# Patient Record
Sex: Male | Born: 1935 | Race: White | Hispanic: No | Marital: Married | Smoking: Former smoker
Health system: Southern US, Community
[De-identification: ages and names within clinical notes are randomized; demographics above are authoritative.]

## PROBLEM LIST (undated history)

## (undated) DIAGNOSIS — I739 Peripheral vascular disease, unspecified: Secondary | ICD-10-CM

## (undated) DIAGNOSIS — N200 Calculus of kidney: Secondary | ICD-10-CM

## (undated) DIAGNOSIS — K219 Gastro-esophageal reflux disease without esophagitis: Secondary | ICD-10-CM

## (undated) DIAGNOSIS — I700 Atherosclerosis of aorta: Secondary | ICD-10-CM

## (undated) DIAGNOSIS — I440 Atrioventricular block, first degree: Secondary | ICD-10-CM

## (undated) DIAGNOSIS — I10 Essential (primary) hypertension: Secondary | ICD-10-CM

## (undated) DIAGNOSIS — M779 Enthesopathy, unspecified: Secondary | ICD-10-CM

## (undated) DIAGNOSIS — E785 Hyperlipidemia, unspecified: Secondary | ICD-10-CM

## (undated) HISTORY — DX: Gastro-esophageal reflux disease without esophagitis: K21.9

## (undated) HISTORY — DX: Hyperlipidemia, unspecified: E78.5

## (undated) HISTORY — PX: COLONOSCOPY: SHX174

## (undated) HISTORY — PX: MELANOMA EXCISION: SHX5266

## (undated) HISTORY — PX: CYST REMOVAL NECK: SHX6281

## (undated) HISTORY — PX: BACK SURGERY: SHX140

## (undated) HISTORY — DX: Enthesopathy, unspecified: M77.9

## (undated) HISTORY — PX: CATARACT EXTRACTION: SUR2

## (undated) HISTORY — DX: Essential (primary) hypertension: I10

---

## 1989-08-21 DIAGNOSIS — Z85820 Personal history of malignant melanoma of skin: Secondary | ICD-10-CM

## 1989-08-21 DIAGNOSIS — C439 Malignant melanoma of skin, unspecified: Secondary | ICD-10-CM

## 1989-08-21 HISTORY — DX: Personal history of malignant melanoma of skin: Z85.820

## 1989-08-21 HISTORY — PX: MELANOMA EXCISION: SHX5266

## 1989-08-21 HISTORY — DX: Malignant melanoma of skin, unspecified: C43.9

## 2002-05-28 DIAGNOSIS — Z86018 Personal history of other benign neoplasm: Secondary | ICD-10-CM

## 2002-05-28 HISTORY — DX: Personal history of other benign neoplasm: Z86.018

## 2005-05-29 ENCOUNTER — Ambulatory Visit: Payer: Self-pay | Admitting: Unknown Physician Specialty

## 2005-08-21 DIAGNOSIS — Z85828 Personal history of other malignant neoplasm of skin: Secondary | ICD-10-CM

## 2005-08-21 HISTORY — DX: Personal history of other malignant neoplasm of skin: Z85.828

## 2006-08-10 ENCOUNTER — Ambulatory Visit: Payer: Self-pay | Admitting: Dermatology

## 2006-08-15 ENCOUNTER — Ambulatory Visit: Payer: Self-pay

## 2009-02-11 DIAGNOSIS — C4491 Basal cell carcinoma of skin, unspecified: Secondary | ICD-10-CM

## 2009-02-11 HISTORY — DX: Basal cell carcinoma of skin, unspecified: C44.91

## 2009-10-19 DIAGNOSIS — K219 Gastro-esophageal reflux disease without esophagitis: Secondary | ICD-10-CM | POA: Insufficient documentation

## 2010-07-27 ENCOUNTER — Ambulatory Visit: Payer: Self-pay | Admitting: Family Medicine

## 2010-08-21 DIAGNOSIS — Z85828 Personal history of other malignant neoplasm of skin: Secondary | ICD-10-CM

## 2010-08-21 HISTORY — DX: Personal history of other malignant neoplasm of skin: Z85.828

## 2011-07-27 LAB — CBC AND DIFFERENTIAL
HEMATOCRIT: 42 % (ref 41–53)
Hemoglobin: 13.9 g/dL (ref 13.5–17.5)
NEUTROS ABS: 59 /uL
PLATELETS: 238 10*3/uL (ref 150–399)
WBC: 4.6 10*3/mL

## 2011-09-27 DIAGNOSIS — Z85828 Personal history of other malignant neoplasm of skin: Secondary | ICD-10-CM | POA: Diagnosis not present

## 2011-09-27 DIAGNOSIS — Z85820 Personal history of malignant melanoma of skin: Secondary | ICD-10-CM | POA: Diagnosis not present

## 2011-09-27 DIAGNOSIS — L570 Actinic keratosis: Secondary | ICD-10-CM | POA: Diagnosis not present

## 2011-09-27 DIAGNOSIS — L821 Other seborrheic keratosis: Secondary | ICD-10-CM | POA: Diagnosis not present

## 2011-10-26 ENCOUNTER — Ambulatory Visit: Payer: Self-pay | Admitting: Family Medicine

## 2011-10-26 DIAGNOSIS — I10 Essential (primary) hypertension: Secondary | ICD-10-CM | POA: Diagnosis not present

## 2011-10-26 DIAGNOSIS — R05 Cough: Secondary | ICD-10-CM | POA: Diagnosis not present

## 2011-10-26 DIAGNOSIS — R059 Cough, unspecified: Secondary | ICD-10-CM | POA: Diagnosis not present

## 2011-10-26 DIAGNOSIS — Z0000 Encounter for general adult medical examination without abnormal findings: Secondary | ICD-10-CM | POA: Diagnosis not present

## 2011-10-26 DIAGNOSIS — R918 Other nonspecific abnormal finding of lung field: Secondary | ICD-10-CM | POA: Diagnosis not present

## 2011-10-26 DIAGNOSIS — J209 Acute bronchitis, unspecified: Secondary | ICD-10-CM | POA: Diagnosis not present

## 2011-12-26 ENCOUNTER — Observation Stay: Payer: Self-pay | Admitting: Internal Medicine

## 2011-12-26 DIAGNOSIS — Z7982 Long term (current) use of aspirin: Secondary | ICD-10-CM | POA: Diagnosis not present

## 2011-12-26 DIAGNOSIS — I10 Essential (primary) hypertension: Secondary | ICD-10-CM | POA: Diagnosis not present

## 2011-12-26 DIAGNOSIS — E785 Hyperlipidemia, unspecified: Secondary | ICD-10-CM | POA: Diagnosis not present

## 2011-12-26 DIAGNOSIS — Z820 Family history of epilepsy and other diseases of the nervous system: Secondary | ICD-10-CM | POA: Diagnosis not present

## 2011-12-26 DIAGNOSIS — Z8249 Family history of ischemic heart disease and other diseases of the circulatory system: Secondary | ICD-10-CM | POA: Diagnosis not present

## 2011-12-26 DIAGNOSIS — F40298 Other specified phobia: Secondary | ICD-10-CM | POA: Diagnosis not present

## 2011-12-26 DIAGNOSIS — E7800 Pure hypercholesterolemia, unspecified: Secondary | ICD-10-CM | POA: Diagnosis not present

## 2011-12-26 DIAGNOSIS — E782 Mixed hyperlipidemia: Secondary | ICD-10-CM | POA: Diagnosis not present

## 2011-12-26 DIAGNOSIS — M79609 Pain in unspecified limb: Secondary | ICD-10-CM | POA: Diagnosis not present

## 2011-12-26 DIAGNOSIS — Z79899 Other long term (current) drug therapy: Secondary | ICD-10-CM | POA: Diagnosis not present

## 2011-12-26 DIAGNOSIS — G459 Transient cerebral ischemic attack, unspecified: Secondary | ICD-10-CM | POA: Diagnosis not present

## 2011-12-26 DIAGNOSIS — R209 Unspecified disturbances of skin sensation: Secondary | ICD-10-CM | POA: Diagnosis not present

## 2011-12-26 LAB — URINALYSIS, COMPLETE
Bilirubin,UR: NEGATIVE
Glucose,UR: NEGATIVE mg/dL (ref 0–75)
Ketone: NEGATIVE
Leukocyte Esterase: NEGATIVE
Nitrite: NEGATIVE
Ph: 7 (ref 4.5–8.0)
Protein: NEGATIVE
RBC,UR: NONE SEEN /HPF (ref 0–5)
Specific Gravity: 1.002 (ref 1.003–1.030)
Squamous Epithelial: NONE SEEN
WBC UR: NONE SEEN /HPF (ref 0–5)

## 2011-12-26 LAB — CK TOTAL AND CKMB (NOT AT ARMC)
CK, Total: 129 U/L (ref 35–232)
CK-MB: 2.3 ng/mL (ref 0.5–3.6)

## 2011-12-26 LAB — COMPREHENSIVE METABOLIC PANEL
Albumin: 4.1 g/dL (ref 3.4–5.0)
Alkaline Phosphatase: 91 U/L (ref 50–136)
Anion Gap: 6 — ABNORMAL LOW (ref 7–16)
BUN: 12 mg/dL (ref 7–18)
Bilirubin,Total: 0.5 mg/dL (ref 0.2–1.0)
Calcium, Total: 9 mg/dL (ref 8.5–10.1)
Chloride: 105 mmol/L (ref 98–107)
EGFR (African American): >60
Osmolality: 285 (ref 275–301)
Total Protein: 7.5 g/dL (ref 6.4–8.2)

## 2011-12-26 LAB — CBC
HCT: 43.3 % (ref 40.0–52.0)
HGB: 14.2 g/dL (ref 13.0–18.0)
MCH: 30.1 pg (ref 26.0–34.0)
MCV: 92 fL (ref 80–100)
Platelet: 206 10*3/uL (ref 150–440)
RBC: 4.71 10*6/uL (ref 4.40–5.90)
WBC: 5 10*3/uL (ref 3.8–10.6)

## 2011-12-26 LAB — LIPID PANEL
HDL Cholesterol: 47 mg/dL (ref 40–60)
Ldl Cholesterol, Calc: 99 mg/dL (ref 0–100)
Triglycerides: 127 mg/dL (ref 0–200)
VLDL Cholesterol, Calc: 25 mg/dL (ref 5–40)

## 2011-12-26 LAB — TROPONIN I: Troponin-I: <0.02 ng/mL

## 2011-12-27 DIAGNOSIS — I10 Essential (primary) hypertension: Secondary | ICD-10-CM | POA: Diagnosis not present

## 2011-12-27 DIAGNOSIS — R209 Unspecified disturbances of skin sensation: Secondary | ICD-10-CM | POA: Diagnosis not present

## 2011-12-27 DIAGNOSIS — E782 Mixed hyperlipidemia: Secondary | ICD-10-CM | POA: Diagnosis not present

## 2011-12-27 DIAGNOSIS — G459 Transient cerebral ischemic attack, unspecified: Secondary | ICD-10-CM | POA: Diagnosis not present

## 2011-12-27 LAB — TROPONIN I: Troponin-I: <0.02 ng/mL

## 2012-01-03 DIAGNOSIS — E7800 Pure hypercholesterolemia, unspecified: Secondary | ICD-10-CM | POA: Diagnosis not present

## 2012-01-03 DIAGNOSIS — G459 Transient cerebral ischemic attack, unspecified: Secondary | ICD-10-CM | POA: Diagnosis not present

## 2012-01-03 DIAGNOSIS — I10 Essential (primary) hypertension: Secondary | ICD-10-CM | POA: Diagnosis not present

## 2012-01-08 DIAGNOSIS — R55 Syncope and collapse: Secondary | ICD-10-CM | POA: Diagnosis not present

## 2012-01-22 DIAGNOSIS — I10 Essential (primary) hypertension: Secondary | ICD-10-CM | POA: Diagnosis not present

## 2012-01-22 DIAGNOSIS — Z79899 Other long term (current) drug therapy: Secondary | ICD-10-CM | POA: Diagnosis not present

## 2012-01-22 DIAGNOSIS — E785 Hyperlipidemia, unspecified: Secondary | ICD-10-CM | POA: Diagnosis not present

## 2012-01-22 DIAGNOSIS — E7800 Pure hypercholesterolemia, unspecified: Secondary | ICD-10-CM | POA: Diagnosis not present

## 2012-03-14 DIAGNOSIS — M25519 Pain in unspecified shoulder: Secondary | ICD-10-CM | POA: Diagnosis not present

## 2012-03-14 DIAGNOSIS — M129 Arthropathy, unspecified: Secondary | ICD-10-CM | POA: Diagnosis not present

## 2012-03-14 DIAGNOSIS — M779 Enthesopathy, unspecified: Secondary | ICD-10-CM | POA: Diagnosis not present

## 2012-04-12 DIAGNOSIS — H2510 Age-related nuclear cataract, unspecified eye: Secondary | ICD-10-CM | POA: Diagnosis not present

## 2012-05-07 DIAGNOSIS — Z1211 Encounter for screening for malignant neoplasm of colon: Secondary | ICD-10-CM | POA: Diagnosis not present

## 2012-05-07 DIAGNOSIS — R1310 Dysphagia, unspecified: Secondary | ICD-10-CM | POA: Diagnosis not present

## 2012-05-08 ENCOUNTER — Ambulatory Visit: Payer: Self-pay | Admitting: Ophthalmology

## 2012-05-08 DIAGNOSIS — H2510 Age-related nuclear cataract, unspecified eye: Secondary | ICD-10-CM | POA: Diagnosis not present

## 2012-05-08 DIAGNOSIS — I10 Essential (primary) hypertension: Secondary | ICD-10-CM | POA: Diagnosis not present

## 2012-05-08 DIAGNOSIS — Z01810 Encounter for preprocedural cardiovascular examination: Secondary | ICD-10-CM | POA: Diagnosis not present

## 2012-05-14 DIAGNOSIS — Z85828 Personal history of other malignant neoplasm of skin: Secondary | ICD-10-CM | POA: Diagnosis not present

## 2012-05-14 DIAGNOSIS — B359 Dermatophytosis, unspecified: Secondary | ICD-10-CM | POA: Diagnosis not present

## 2012-05-14 DIAGNOSIS — Z85820 Personal history of malignant melanoma of skin: Secondary | ICD-10-CM | POA: Diagnosis not present

## 2012-05-14 DIAGNOSIS — L570 Actinic keratosis: Secondary | ICD-10-CM | POA: Diagnosis not present

## 2012-05-14 DIAGNOSIS — L820 Inflamed seborrheic keratosis: Secondary | ICD-10-CM | POA: Diagnosis not present

## 2012-05-20 ENCOUNTER — Ambulatory Visit: Payer: Self-pay | Admitting: Ophthalmology

## 2012-05-20 DIAGNOSIS — M129 Arthropathy, unspecified: Secondary | ICD-10-CM | POA: Diagnosis not present

## 2012-05-20 DIAGNOSIS — I10 Essential (primary) hypertension: Secondary | ICD-10-CM | POA: Diagnosis not present

## 2012-05-20 DIAGNOSIS — H269 Unspecified cataract: Secondary | ICD-10-CM | POA: Diagnosis not present

## 2012-05-20 DIAGNOSIS — I498 Other specified cardiac arrhythmias: Secondary | ICD-10-CM | POA: Diagnosis not present

## 2012-05-20 DIAGNOSIS — Z87891 Personal history of nicotine dependence: Secondary | ICD-10-CM | POA: Diagnosis not present

## 2012-05-20 DIAGNOSIS — Z85820 Personal history of malignant melanoma of skin: Secondary | ICD-10-CM | POA: Diagnosis not present

## 2012-05-20 DIAGNOSIS — H9190 Unspecified hearing loss, unspecified ear: Secondary | ICD-10-CM | POA: Diagnosis not present

## 2012-05-20 DIAGNOSIS — K219 Gastro-esophageal reflux disease without esophagitis: Secondary | ICD-10-CM | POA: Diagnosis not present

## 2012-05-20 DIAGNOSIS — H2510 Age-related nuclear cataract, unspecified eye: Secondary | ICD-10-CM | POA: Diagnosis not present

## 2012-05-20 DIAGNOSIS — Z79899 Other long term (current) drug therapy: Secondary | ICD-10-CM | POA: Diagnosis not present

## 2012-05-20 DIAGNOSIS — E7800 Pure hypercholesterolemia, unspecified: Secondary | ICD-10-CM | POA: Diagnosis not present

## 2012-05-27 DIAGNOSIS — Z23 Encounter for immunization: Secondary | ICD-10-CM | POA: Diagnosis not present

## 2012-06-11 ENCOUNTER — Ambulatory Visit: Payer: Self-pay | Admitting: Unknown Physician Specialty

## 2012-06-11 DIAGNOSIS — K5730 Diverticulosis of large intestine without perforation or abscess without bleeding: Secondary | ICD-10-CM | POA: Diagnosis not present

## 2012-06-11 DIAGNOSIS — Z1211 Encounter for screening for malignant neoplasm of colon: Secondary | ICD-10-CM | POA: Diagnosis not present

## 2012-06-11 DIAGNOSIS — R0602 Shortness of breath: Secondary | ICD-10-CM | POA: Diagnosis not present

## 2012-06-11 DIAGNOSIS — K222 Esophageal obstruction: Secondary | ICD-10-CM | POA: Diagnosis not present

## 2012-06-11 DIAGNOSIS — K449 Diaphragmatic hernia without obstruction or gangrene: Secondary | ICD-10-CM | POA: Diagnosis not present

## 2012-06-11 DIAGNOSIS — Z9889 Other specified postprocedural states: Secondary | ICD-10-CM | POA: Diagnosis not present

## 2012-06-11 DIAGNOSIS — R1310 Dysphagia, unspecified: Secondary | ICD-10-CM | POA: Diagnosis not present

## 2012-06-11 DIAGNOSIS — Z8673 Personal history of transient ischemic attack (TIA), and cerebral infarction without residual deficits: Secondary | ICD-10-CM | POA: Diagnosis not present

## 2012-06-11 DIAGNOSIS — D126 Benign neoplasm of colon, unspecified: Secondary | ICD-10-CM | POA: Diagnosis not present

## 2012-06-11 DIAGNOSIS — K219 Gastro-esophageal reflux disease without esophagitis: Secondary | ICD-10-CM | POA: Diagnosis not present

## 2012-06-11 DIAGNOSIS — Z7982 Long term (current) use of aspirin: Secondary | ICD-10-CM | POA: Diagnosis not present

## 2012-06-11 DIAGNOSIS — Z79899 Other long term (current) drug therapy: Secondary | ICD-10-CM | POA: Diagnosis not present

## 2012-06-11 DIAGNOSIS — I10 Essential (primary) hypertension: Secondary | ICD-10-CM | POA: Diagnosis not present

## 2012-06-11 DIAGNOSIS — Z87891 Personal history of nicotine dependence: Secondary | ICD-10-CM | POA: Diagnosis not present

## 2012-06-11 DIAGNOSIS — K648 Other hemorrhoids: Secondary | ICD-10-CM | POA: Diagnosis not present

## 2012-06-11 LAB — HM COLONOSCOPY

## 2012-07-25 DIAGNOSIS — Z0000 Encounter for general adult medical examination without abnormal findings: Secondary | ICD-10-CM | POA: Diagnosis not present

## 2012-07-25 DIAGNOSIS — Z125 Encounter for screening for malignant neoplasm of prostate: Secondary | ICD-10-CM | POA: Diagnosis not present

## 2012-07-25 DIAGNOSIS — H2510 Age-related nuclear cataract, unspecified eye: Secondary | ICD-10-CM | POA: Diagnosis not present

## 2012-07-25 DIAGNOSIS — Z1331 Encounter for screening for depression: Secondary | ICD-10-CM | POA: Diagnosis not present

## 2012-07-25 DIAGNOSIS — Z1339 Encounter for screening examination for other mental health and behavioral disorders: Secondary | ICD-10-CM | POA: Diagnosis not present

## 2012-08-05 ENCOUNTER — Ambulatory Visit: Payer: Self-pay | Admitting: Ophthalmology

## 2012-08-05 DIAGNOSIS — K219 Gastro-esophageal reflux disease without esophagitis: Secondary | ICD-10-CM | POA: Diagnosis not present

## 2012-08-05 DIAGNOSIS — Z79899 Other long term (current) drug therapy: Secondary | ICD-10-CM | POA: Diagnosis not present

## 2012-08-05 DIAGNOSIS — Z85820 Personal history of malignant melanoma of skin: Secondary | ICD-10-CM | POA: Diagnosis not present

## 2012-08-05 DIAGNOSIS — M129 Arthropathy, unspecified: Secondary | ICD-10-CM | POA: Diagnosis not present

## 2012-08-05 DIAGNOSIS — H269 Unspecified cataract: Secondary | ICD-10-CM | POA: Diagnosis not present

## 2012-08-05 DIAGNOSIS — E7800 Pure hypercholesterolemia, unspecified: Secondary | ICD-10-CM | POA: Diagnosis not present

## 2012-08-05 DIAGNOSIS — H2510 Age-related nuclear cataract, unspecified eye: Secondary | ICD-10-CM | POA: Diagnosis not present

## 2012-08-05 DIAGNOSIS — Z7982 Long term (current) use of aspirin: Secondary | ICD-10-CM | POA: Diagnosis not present

## 2012-08-05 DIAGNOSIS — I498 Other specified cardiac arrhythmias: Secondary | ICD-10-CM | POA: Diagnosis not present

## 2012-08-05 DIAGNOSIS — I10 Essential (primary) hypertension: Secondary | ICD-10-CM | POA: Diagnosis not present

## 2012-11-14 DIAGNOSIS — D485 Neoplasm of uncertain behavior of skin: Secondary | ICD-10-CM | POA: Diagnosis not present

## 2012-11-14 DIAGNOSIS — L821 Other seborrheic keratosis: Secondary | ICD-10-CM | POA: Diagnosis not present

## 2012-11-14 DIAGNOSIS — C44611 Basal cell carcinoma of skin of unspecified upper limb, including shoulder: Secondary | ICD-10-CM | POA: Diagnosis not present

## 2012-11-14 DIAGNOSIS — Z85828 Personal history of other malignant neoplasm of skin: Secondary | ICD-10-CM | POA: Diagnosis not present

## 2012-11-14 DIAGNOSIS — Z85820 Personal history of malignant melanoma of skin: Secondary | ICD-10-CM | POA: Diagnosis not present

## 2012-11-14 DIAGNOSIS — L570 Actinic keratosis: Secondary | ICD-10-CM | POA: Diagnosis not present

## 2012-11-14 DIAGNOSIS — L820 Inflamed seborrheic keratosis: Secondary | ICD-10-CM | POA: Diagnosis not present

## 2012-12-26 DIAGNOSIS — Z85828 Personal history of other malignant neoplasm of skin: Secondary | ICD-10-CM | POA: Diagnosis not present

## 2012-12-26 DIAGNOSIS — L570 Actinic keratosis: Secondary | ICD-10-CM | POA: Diagnosis not present

## 2012-12-26 DIAGNOSIS — C44611 Basal cell carcinoma of skin of unspecified upper limb, including shoulder: Secondary | ICD-10-CM | POA: Diagnosis not present

## 2012-12-26 DIAGNOSIS — L578 Other skin changes due to chronic exposure to nonionizing radiation: Secondary | ICD-10-CM | POA: Diagnosis not present

## 2013-01-23 DIAGNOSIS — I10 Essential (primary) hypertension: Secondary | ICD-10-CM | POA: Diagnosis not present

## 2013-01-23 DIAGNOSIS — E785 Hyperlipidemia, unspecified: Secondary | ICD-10-CM | POA: Diagnosis not present

## 2013-01-23 DIAGNOSIS — E7800 Pure hypercholesterolemia, unspecified: Secondary | ICD-10-CM | POA: Diagnosis not present

## 2013-01-23 DIAGNOSIS — Z79899 Other long term (current) drug therapy: Secondary | ICD-10-CM | POA: Diagnosis not present

## 2013-05-08 DIAGNOSIS — Z961 Presence of intraocular lens: Secondary | ICD-10-CM | POA: Diagnosis not present

## 2013-05-08 DIAGNOSIS — H2510 Age-related nuclear cataract, unspecified eye: Secondary | ICD-10-CM | POA: Diagnosis not present

## 2013-05-19 DIAGNOSIS — L578 Other skin changes due to chronic exposure to nonionizing radiation: Secondary | ICD-10-CM | POA: Diagnosis not present

## 2013-05-19 DIAGNOSIS — Z85828 Personal history of other malignant neoplasm of skin: Secondary | ICD-10-CM | POA: Diagnosis not present

## 2013-05-19 DIAGNOSIS — D239 Other benign neoplasm of skin, unspecified: Secondary | ICD-10-CM | POA: Diagnosis not present

## 2013-05-19 DIAGNOSIS — L820 Inflamed seborrheic keratosis: Secondary | ICD-10-CM | POA: Diagnosis not present

## 2013-05-19 DIAGNOSIS — L821 Other seborrheic keratosis: Secondary | ICD-10-CM | POA: Diagnosis not present

## 2013-05-19 DIAGNOSIS — L570 Actinic keratosis: Secondary | ICD-10-CM | POA: Diagnosis not present

## 2013-06-02 DIAGNOSIS — Z23 Encounter for immunization: Secondary | ICD-10-CM | POA: Diagnosis not present

## 2013-07-31 DIAGNOSIS — Z0000 Encounter for general adult medical examination without abnormal findings: Secondary | ICD-10-CM | POA: Diagnosis not present

## 2013-07-31 DIAGNOSIS — Z1331 Encounter for screening for depression: Secondary | ICD-10-CM | POA: Diagnosis not present

## 2013-07-31 DIAGNOSIS — Z1339 Encounter for screening examination for other mental health and behavioral disorders: Secondary | ICD-10-CM | POA: Diagnosis not present

## 2013-07-31 LAB — PSA: PSA: 0.9

## 2013-09-08 DIAGNOSIS — L821 Other seborrheic keratosis: Secondary | ICD-10-CM | POA: Diagnosis not present

## 2013-09-08 DIAGNOSIS — Z85820 Personal history of malignant melanoma of skin: Secondary | ICD-10-CM | POA: Diagnosis not present

## 2013-09-08 DIAGNOSIS — L578 Other skin changes due to chronic exposure to nonionizing radiation: Secondary | ICD-10-CM | POA: Diagnosis not present

## 2013-09-08 DIAGNOSIS — R234 Changes in skin texture: Secondary | ICD-10-CM | POA: Diagnosis not present

## 2013-09-08 DIAGNOSIS — L570 Actinic keratosis: Secondary | ICD-10-CM | POA: Diagnosis not present

## 2013-09-08 DIAGNOSIS — Z85828 Personal history of other malignant neoplasm of skin: Secondary | ICD-10-CM | POA: Diagnosis not present

## 2013-09-08 DIAGNOSIS — L820 Inflamed seborrheic keratosis: Secondary | ICD-10-CM | POA: Diagnosis not present

## 2014-01-01 IMAGING — CR DG CHEST 2V
1 series · 2 of 2 positions shown · non-contrast
Comparison: none

REASON FOR EXAM: L arm pain
COMMENTS:

PROCEDURE:     DXR - DXR CHEST PA (OR AP) AND LATERAL  - December 26, 2011  [DATE]
RESULT:     The lungs are clear. The cardiovascular structures are
unremarkable. Chest is stable from prior exams.

[Series 1: w chest pa · 0.14mm/px · 2 of 2 slices shown]
[im 1/2]
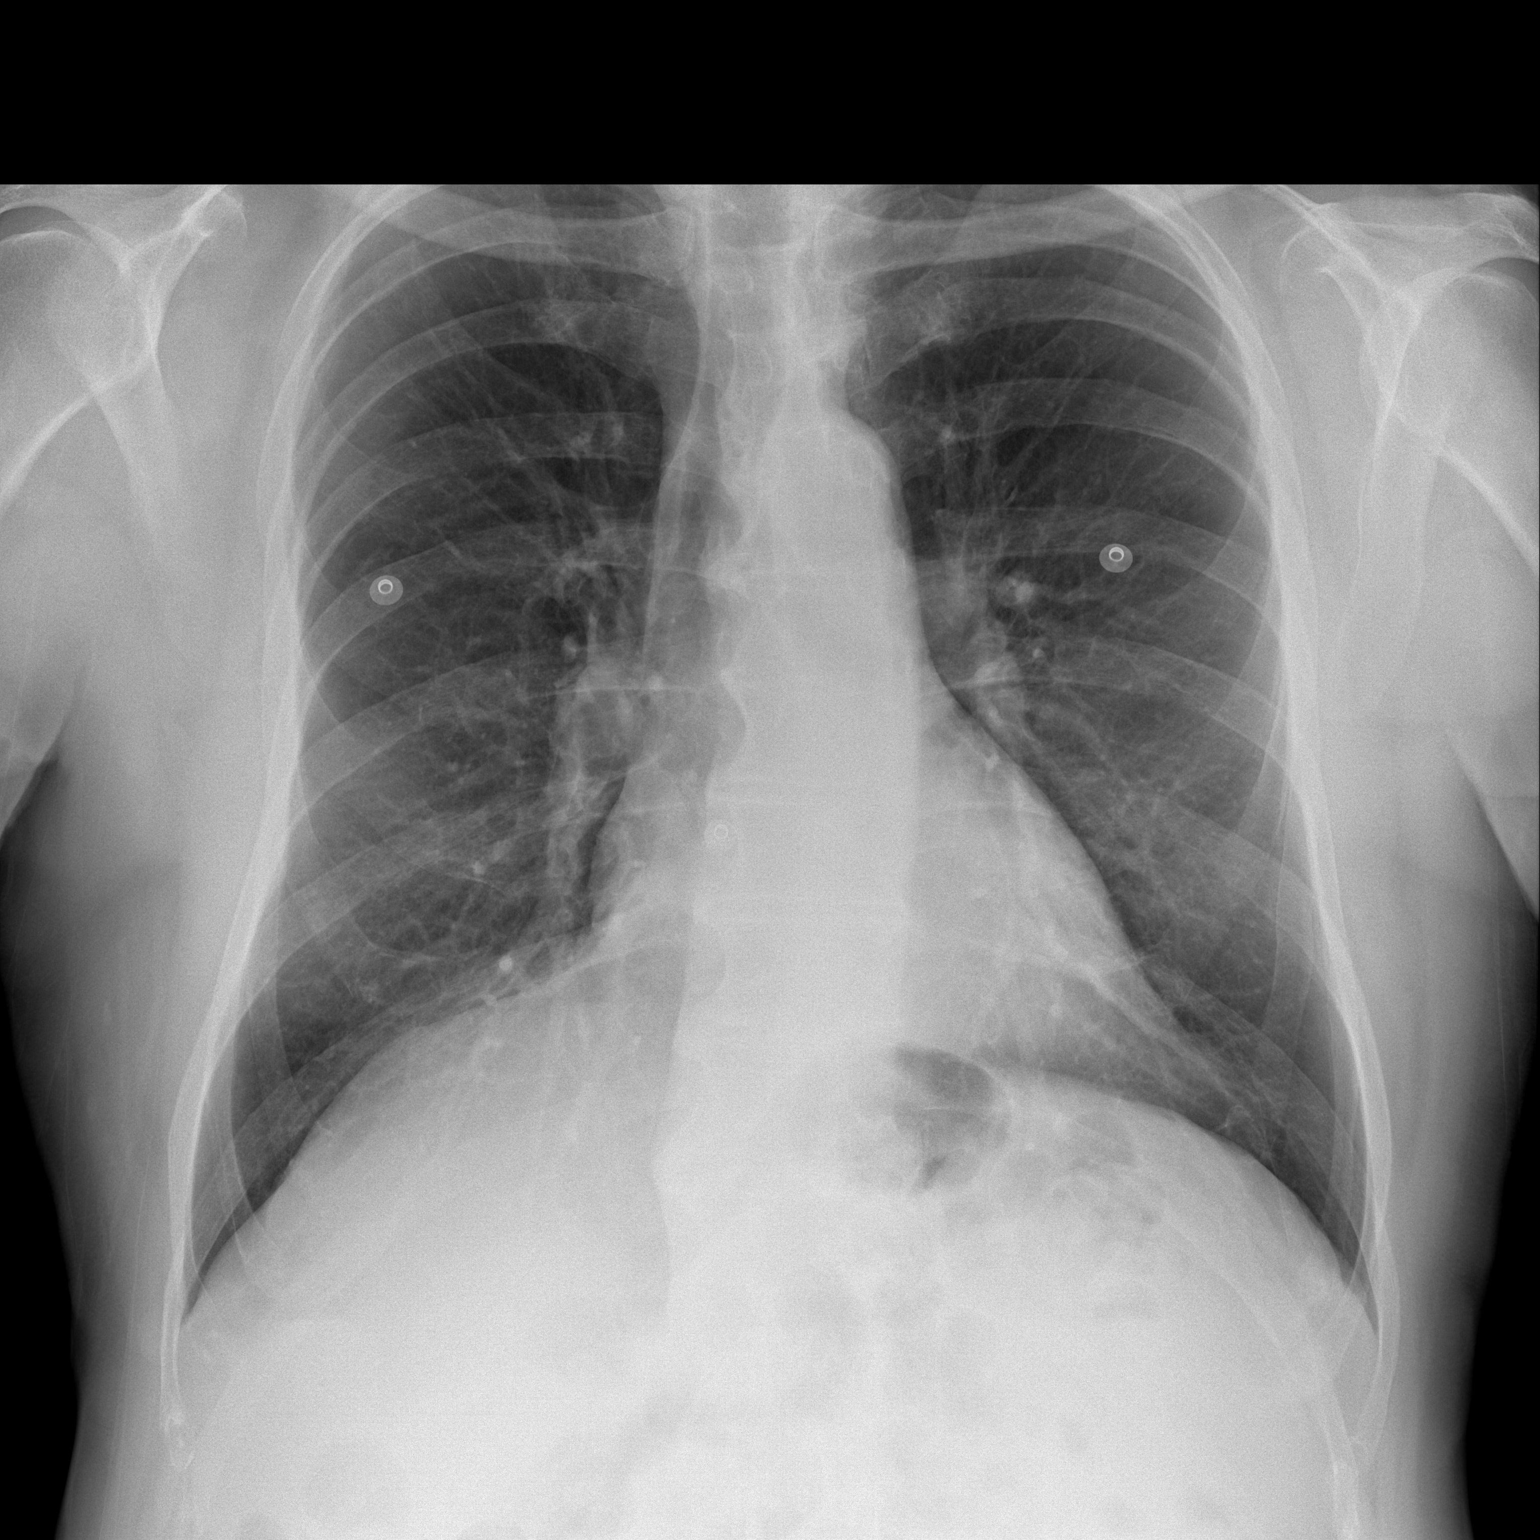
[im 2/2]
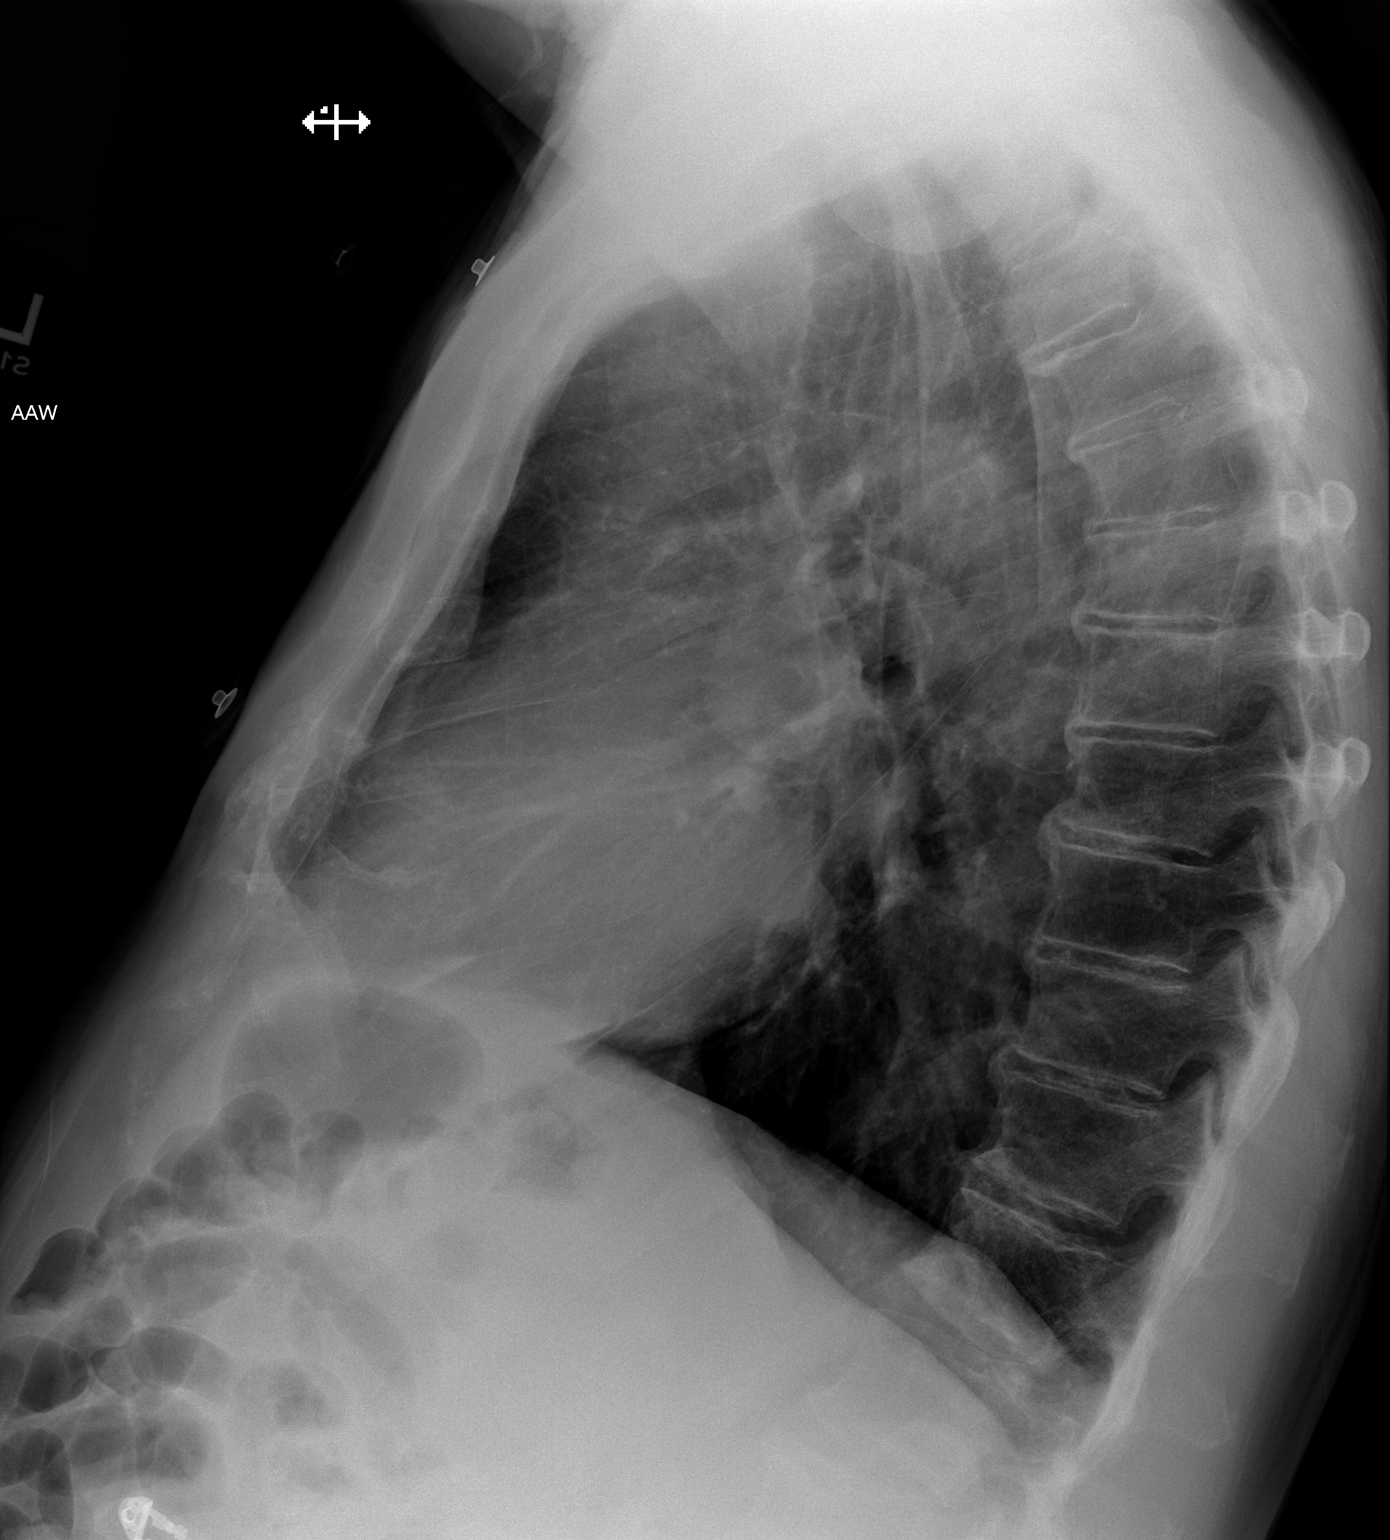

[2 of 2 positions shown; findings below may reference images not displayed]

IMPRESSION: No acute abnormality.

## 2014-02-04 DIAGNOSIS — E7800 Pure hypercholesterolemia, unspecified: Secondary | ICD-10-CM | POA: Diagnosis not present

## 2014-02-04 DIAGNOSIS — I10 Essential (primary) hypertension: Secondary | ICD-10-CM | POA: Diagnosis not present

## 2014-02-04 DIAGNOSIS — K2100 Gastro-esophageal reflux disease with esophagitis, without bleeding: Secondary | ICD-10-CM | POA: Diagnosis not present

## 2014-02-04 DIAGNOSIS — Z79899 Other long term (current) drug therapy: Secondary | ICD-10-CM | POA: Diagnosis not present

## 2014-02-04 DIAGNOSIS — M129 Arthropathy, unspecified: Secondary | ICD-10-CM | POA: Diagnosis not present

## 2014-02-04 DIAGNOSIS — E785 Hyperlipidemia, unspecified: Secondary | ICD-10-CM | POA: Diagnosis not present

## 2014-02-04 DIAGNOSIS — Z23 Encounter for immunization: Secondary | ICD-10-CM | POA: Diagnosis not present

## 2014-02-04 LAB — HEPATIC FUNCTION PANEL
ALK PHOS: 106 U/L (ref 25–125)
ALT: 30 U/L (ref 10–40)
AST: 31 U/L (ref 14–40)
BILIRUBIN, TOTAL: 0.3 mg/dL

## 2014-02-04 LAB — BASIC METABOLIC PANEL
BUN: 19 mg/dL (ref 4–21)
Creatinine: 1 mg/dL (ref <=1.3)
Glucose: 97 mg/dL
POTASSIUM: 4.9 mmol/L (ref 3.4–5.3)
SODIUM: 144 mmol/L (ref 137–147)

## 2014-02-04 LAB — LIPID PANEL
Cholesterol: 156 mg/dL (ref 0–200)
HDL: 44 mg/dL (ref 35–70)
LDL Cholesterol: 96 mg/dL
LDl/HDL Ratio: 2.2
Triglycerides: 81 mg/dL (ref 40–160)

## 2014-03-10 DIAGNOSIS — D692 Other nonthrombocytopenic purpura: Secondary | ICD-10-CM | POA: Diagnosis not present

## 2014-03-10 DIAGNOSIS — T148 Other injury of unspecified body region: Secondary | ICD-10-CM | POA: Diagnosis not present

## 2014-03-10 DIAGNOSIS — L570 Actinic keratosis: Secondary | ICD-10-CM | POA: Diagnosis not present

## 2014-03-10 DIAGNOSIS — Z85820 Personal history of malignant melanoma of skin: Secondary | ICD-10-CM | POA: Diagnosis not present

## 2014-03-10 DIAGNOSIS — L578 Other skin changes due to chronic exposure to nonionizing radiation: Secondary | ICD-10-CM | POA: Diagnosis not present

## 2014-03-10 DIAGNOSIS — D239 Other benign neoplasm of skin, unspecified: Secondary | ICD-10-CM | POA: Diagnosis not present

## 2014-03-10 DIAGNOSIS — Z85828 Personal history of other malignant neoplasm of skin: Secondary | ICD-10-CM | POA: Diagnosis not present

## 2014-03-10 DIAGNOSIS — L820 Inflamed seborrheic keratosis: Secondary | ICD-10-CM | POA: Diagnosis not present

## 2014-06-04 DIAGNOSIS — H532 Diplopia: Secondary | ICD-10-CM | POA: Diagnosis not present

## 2014-06-04 DIAGNOSIS — Z23 Encounter for immunization: Secondary | ICD-10-CM | POA: Diagnosis not present

## 2014-08-04 DIAGNOSIS — Z0000 Encounter for general adult medical examination without abnormal findings: Secondary | ICD-10-CM | POA: Diagnosis not present

## 2014-09-10 DIAGNOSIS — D485 Neoplasm of uncertain behavior of skin: Secondary | ICD-10-CM | POA: Diagnosis not present

## 2014-09-10 DIAGNOSIS — Z85820 Personal history of malignant melanoma of skin: Secondary | ICD-10-CM | POA: Diagnosis not present

## 2014-09-10 DIAGNOSIS — L570 Actinic keratosis: Secondary | ICD-10-CM | POA: Diagnosis not present

## 2014-09-10 DIAGNOSIS — D1800 Hemangioma unspecified site: Secondary | ICD-10-CM | POA: Diagnosis not present

## 2014-09-10 DIAGNOSIS — Z1283 Encounter for screening for malignant neoplasm of skin: Secondary | ICD-10-CM | POA: Diagnosis not present

## 2014-09-10 DIAGNOSIS — D229 Melanocytic nevi, unspecified: Secondary | ICD-10-CM | POA: Diagnosis not present

## 2014-09-10 DIAGNOSIS — L821 Other seborrheic keratosis: Secondary | ICD-10-CM | POA: Diagnosis not present

## 2014-09-10 DIAGNOSIS — L578 Other skin changes due to chronic exposure to nonionizing radiation: Secondary | ICD-10-CM | POA: Diagnosis not present

## 2014-09-10 DIAGNOSIS — Z85828 Personal history of other malignant neoplasm of skin: Secondary | ICD-10-CM | POA: Diagnosis not present

## 2014-09-10 DIAGNOSIS — L820 Inflamed seborrheic keratosis: Secondary | ICD-10-CM | POA: Diagnosis not present

## 2014-09-10 DIAGNOSIS — L304 Erythema intertrigo: Secondary | ICD-10-CM | POA: Diagnosis not present

## 2014-09-10 DIAGNOSIS — L814 Other melanin hyperpigmentation: Secondary | ICD-10-CM | POA: Diagnosis not present

## 2014-12-08 NOTE — Op Note (Signed)
PATIENT NAME:  Adam Hanna, Adam Hanna MR#:  [id number] DATE OF BIRTH:  September 24, 1925  DATE OF PROCEDURE:  08/05/2012  PREOPERATIVE DIAGNOSIS: Cataract, left eye.   POSTOPERATIVE DIAGNOSIS: Cataract, left eye.   PROCEDURE PERFORMED: Extracapsular cataract extraction using phacoemulsification with placement of an Alcon [id number] 15.5-diopter posterior chamber lens with 1.5 diopters a cylinder, serial number [id number].   SURGEON: Loura Back. Thanh Pomerleau, MD   ANESTHESIA: 4% Lidocaine and 0.75% Marcaine in a 50:50 mixture with 10 units/mL of Hylenex added, given as a peribulbar.   ANESTHESIOLOGIST: Dr. Kayleen Memos.   COMPLICATIONS: None.   ESTIMATED BLOOD LOSS: Less than 1 mL.   DESCRIPTION OF PROCEDURE: The patient was brought to the Operating Room and each eye was anesthetized with topical proparacaine. With the patient sitting upright and fixing at a distant target, the 3:00 and the 9:00 positions were marked using An [employer] Toric Marker. This was reinforced with a marking pen. The patient was placed supine, given IV sedation and peribulbar block. He was then prepped and draped in the usual fashion. The vertical rectus muscles were imbricated using 5-0 silk bridle sutures. The Toric Marker was then brought to the table, and 76 degrees was marked as it was lined up with the 3:00 and 9:00 positions. The was rotated slightly counterclockwise, and a limbal peritomy was carried out at the 80-degree position. Hemostasis was attained with cautery, and a partial thickness scleral groove was made at the posterior surgical limbus and dissected anteriorly into clear cornea with an [employer]. The anterior chamber was entered superonasally through clear cornea with a paracentesis knife and through the lamellar dissection with a 2.6 mm keratome. DisCoVisc was used to replace the aqueous, and a continuous tear circular capsulorrhexis was carried out. Hydrodelineation was used to loosen the nucleus, and  phacoemulsification was carried out in a divide and conquer technique. Total ultrasound time was 1 minute and 27 seconds with an average power of 16.6%, CDE of 24.94. Irrigation and aspiration was used to remove the residual cortex. The capsular bag was inflated with DisCoVisc, and the intraocular lens was inserted using a Librarian, academic. The marks on the haptics were lined up with the 76-degree marks that had been made on the cornea. Irrigation and aspiration was used to remove the residual DisCoVisc. Care was taken to make sure that the lens was still in the right position. The wound was inflated with balanced salt, and Miostat was injected through paracentesis tract. The wound was checked for leaks. None were found. Conjunctiva was closed with cautery. Bridle sutures were removed, and 3 drops of Vigamox were placed on the eye. A shield was placed over the eye. The patient was discharged to the recovery area in good condition.   ____________________________ Loura Back Kylynn [address], MD sad:cb D: 08/05/2012 13:16:58 ET T: 08/06/2012 11:32:04 ET JOB#: [id number]  cc: Remo Lipps A. Shifa Brisbon, MD, <Dictator> Martie Lee MD ELECTRONICALLY SIGNED 08/12/2012 12:47

## 2014-12-08 NOTE — Op Note (Signed)
PATIENT NAME:  Adam Hanna, Adam Hanna MR#:  [id number] DATE OF BIRTH:  08-27-1925  DATE OF PROCEDURE:  05/20/2012  PREOPERATIVE DIAGNOSIS:  Cataract, right eye.   POSTOPERATIVE DIAGNOSIS:  Cataract, right eye.  PROCEDURE PERFORMED:  Extracapsular cataract extraction using phacoemulsification with placement of an Alcon [id number], 16.0-diopter posterior chamber lens, serial [id number].  SURGEON:  Loura Back. Dariann Huckaba, MD  ASSISTANT:  None.  ANESTHESIA:  4% lidocaine and 0.75% Marcaine in a 50/50 mixture with 10 units/mL of Hylenex added, given as a peribulbar.   ANESTHESIOLOGIST:  Julianne Handler, MD  COMPLICATIONS:  None.  ESTIMATED BLOOD LOSS:  Less than 1 ml.  DESCRIPTION OF PROCEDURE:  The patient was brought to the operating room and given a peribulbar block.  The patient was then prepped and draped in the usual fashion.  The vertical rectus muscles were imbricated using 5-0 silk sutures.  These sutures were then clamped to the sterile drapes as bridle sutures.  A limbal peritomy was performed extending two clock hours and hemostasis was obtained with cautery.  A partial thickness scleral groove was made at the surgical limbus and dissected anteriorly in a lamellar dissection using an Alcon crescent knife.  The anterior chamber was entered superonasally with a Superblade and through the lamellar dissection with a 2.6 mm keratome.  DisCoVisc was used to replace the aqueous and a continuous tear capsulorrhexis was carried out.  Hydrodissection and hydrodelineation were carried out with balanced salt and a 27 gauge canula.  The nucleus was rotated to confirm the effectiveness of the hydrodissection.  Phacoemulsification was carried out using a divide-and-conquer technique.  Total ultrasound time was 1 minute and 38 seconds with an average power of 16.4 percent and CDE 28.56.  Irrigation/aspiration was used to remove the residual cortex.  DisCoVisc was used to inflate the capsule and the  internal incision was enlarged to 3 mm with the crescent knife.  The intraocular lens was folded and inserted into the capsular bag using the AcrySert delivery system. Irrigation/aspiration was used to remove the residual DisCoVisc.  Miostat was injected into the anterior chamber through the paracentesis track to inflate the anterior chamber and induce miosis.  The wound was checked for leaks and none were found. The conjunctiva was closed with cautery and the bridle sutures were removed.  Two drops of 0.3% Vigamox were placed on the eye.   An eye shield was placed on the eye.  The patient was discharged to the recovery room in good condition. ____________________________ Loura Back Solmon Bohr, MD sad:slb D: 05/20/2012 12:35:15 ET T: 05/20/2012 12:41:23 ET JOB#: [id number]  cc: Remo Lipps A. Dalayna Lauter, MD, <Dictator> Martie Lee MD ELECTRONICALLY SIGNED 05/27/2012 13:51

## 2014-12-13 NOTE — Discharge Summary (Signed)
PATIENT NAME:  Adam Hanna, Adam Hanna MR#:  [id number] DATE OF BIRTH:  07-11-1926  DATE OF ADMISSION:  12/26/2011 DATE OF DISCHARGE:  12/27/2011  PRIMARY CARE PHYSICIAN: Dr. Rosanna Randy  ER PHYSICIAN: Dr. Ulice Brilliant   CONSULTATIONS: None.   DISCHARGE DIAGNOSES:  1. Left hand tingling and left face tingling. The patient has possible transient ischemic attack. Refused further testing.  2. Hypertension. 3. Hyperlipidemia.   DISCHARGE MEDICATIONS:  1. Aspirin 81 mg daily.  2. Lipitor 20 mg at bedtime.  3. Norvasc 5 mg daily. 4. Probiotics as needed. 5. Centrum Silver 1 tablet daily.   FOLLOWUP: The patient can follow up with Dr. Rosanna Randy in a week or if the symptoms of tingling or numbness worsen.  LABORATORY, DIAGNOSTIC, AND RADIOLOGICAL DATA: LDL 99, VLDL 25, HDL 47, triglycerides 127. Troponin is less than 0.02 times three. CK total 129 and CPK-MB were 2.3. Liver functions are within normal limits. Electrolytes within normal limits. CBC within normal limits. Urinalysis is clear. No leukocyte esterase. Chest x-ray did not show any infiltrate. No acute pneumonia. The patient's TSH is 2.31. EKG normal sinus with some PACs. No ST-T changes.   HOSPITAL COURSE: 81 year old male admitted by me on 05/07 for left hand tingling and left face sensation was different.  See the History and Physical for full details.  The patient had no headache. No visual disturbances. No weakness. No slurred speech and did not have any other symptoms. He was admitted to observation status for possible transient ischemic attack. The patient had no further neurological symptoms and did fine. He refused MRI initially because of claustrophobia even with sedation.  Today he refused to have echo and carotid ultrasound and he said his symptoms have gone and he does not think he had a transient ischemic attack. I explained to him that these tests are recommended due to his age and also hypertension and hypercholesterolemia to make sure he has  no transient ischemic attack, but he persistently refused it and wanted to go home. So I told him that if symptoms get worse in terms of weakness or numbness or slurred speech, he can go to the Emergency Room or call his primary doctor. He understands that and conveyed that he will do that.  The patient can continue his home medications and follow with Dr. Rosanna Randy.  TIME SPENT ON DISCHARGE PREPARATION: More than 30 minutes.    ____________________________ Epifanio Lesches, MD sk:bjt D: 12/27/2011 09:50:41 ET T: 12/27/2011 13:54:14 ET JOB#: [id number]  cc: Epifanio Lesches, MD, <Dictator> Richard L. Rosanna Randy, MD Epifanio Lesches MD ELECTRONICALLY SIGNED 01/03/2012 22:54

## 2014-12-13 NOTE — H&P (Signed)
PATIENT NAME:  Adam Hanna, PRISK MR#:  [id number] DATE OF BIRTH:  1925/09/19  DATE OF ADMISSION:  12/26/2011  PRIMARY CARE PHYSICIAN: Dr. Rosanna Randy  ER PHYSICIAN: Dr. Ulice Brilliant   CHIEF COMPLAINT: Tingling in the left hand and left side of face.   HISTORY OF PRESENT ILLNESS: 81 year old male with history of hypertension, hyperlipidemia, tingling in the left hand around wrist area around 8:00 this morning which has subsided after 30-40 minutes. At the same time he noticed somewhat different feeling on the left side of the face, kind of numb on the left side of face which actually resolved by the time they came to the Emergency Room. Patient did not have any slurred speech. No headache. No weakness in hands or legs. No gait disturbances and no numbness in any other parts of the area. Patient went to CAT scan of the head and after coming back from CAT scan he again noted to have some tingling in the left hand and numb on the left side of his face  which have resolved, probably 10 minutes. I was asked to admit for possible transient ischemic attack. Patient had no chest pain, no trouble breathing, no dizziness and the symptoms did not happen before.   PAST MEDICAL HISTORY:  1. Hypertension. 2. Hyperlipidemia.  3. History of stress test years ago, like six years ago, with Dr. Neoma Laming, reportedly it was normal.   ALLERGIES: No known allergies.   SOCIAL HISTORY: No smoking. Drinks two glasses of wine before dinner and after dinner. No drugs.   PAST SURGICAL HISTORY: No history of coronary artery disease. No stents. No history of gallbladder surgery. Patient had no recent surgeries.   FAMILY HISTORY: Mother and father both were healthy up to 26 years and mother died of heart failure, father died of advanced dementia.  MEDICATIONS:  1. Norvasc 5 mg daily. 2. Lipitor 20 mg daily. 3. Aspirin 81 mg daily.   REVIEW OF SYSTEMS: CONSTITUTIONAL: Denies any fatigue. EYES: No blurred vision. ENT: Has no ear  pain. No epistaxis. No difficulty swallowing. RESPIRATORY: Has no cough. No wheezing. CARDIOVASCULAR: Denies any chest pain and no palpitations. GASTROINTESTINAL: No nausea. No vomiting. No appetite problems. NEUROLOGIC: Patient has tingling in the left hand and then on the left side of the face this morning which resolved now. PSYCH: Has anxiety or insomnia.   PHYSICAL EXAMINATION:  VITAL SIGNS: Temperature 97.7, initial blood pressure 180/85, repeat 144/73, heart rate 77, sats 95% on room air.   GENERAL: He is alert, awake, oriented, well-nourished 81 year old male, not in distress, answering questions appropriately.   HEENT: Head atraumatic, normocephalic. Pupils are equally reacting to light. Extraocular movements are intact. ENT: The patient has no tympanic membrane congestion. Hearing is intact. Nose: No turbinate hypertrophy. Throat: No oropharyngeal erythema.   NECK: Normal range of motion. No carotid bruit. No lymph node enlargement. No thyroid enlargement. Patient has no JVD.   CARDIOVASCULAR: S1, S2 regular. PMI not displaced. No chest wall tenderness. Pulses are equal and full in dorsalis pedis and femoral regions.   LUNGS: Patient is not using accessory muscles of respiration. Clear to auscultation bilaterally.   ABDOMEN: Soft, nontender, nondistended. No hernias. No organomegaly. Bowel sounds present.   EXTREMITIES: No extremity edema. No cyanosis. No clubbing.   NEUROLOGIC: Patient is alert, awake, oriented. Cranial nerves II through XII intact. Power 5/5 upper and lower extremities. Sensations are intact bilaterally upper and lower extremities. Right now he denies any numbness on his face or the arm.  Deep tendon reflexes 2+ bilaterally upper and lower extremities. Finger-nose test is intact.    PSYCH: Mood and affect are within normal limits.   LABORATORY, DIAGNOSTIC AND RADIOLOGICAL DATA: Chest x-ray shows no acute abnormality. EKG normal sinus rhythm with PACs. No ST-T  changes, 69 beats per minute. Urinalysis: Clear. WBC 5, hemoglobin 14.2, hematocrit 43.3, platelets 203. Electrolytes: Sodium 143, potassium 4.2, chloride 105, bicarbonate 32, BUN 12, creatinine 0.96, glucose 105. Liver functions within normal limits. Troponin less than 0.02. CT of the head showed mild atrophy. No acute intracranial abnormality.   ASSESSMENT AND PLAN: 81 year old male with:  1. Tingling in the left hand and face which appeared transiently, now resolved. Patient is going to be placed in observation for possible transient ischemic attack. Patient says that he cannot have MRI because of claustrophobia and refuses MRI. He will get carotid ultrasound, echocardiogram. Continue aspirin and statins and will do neurological checks q.2 hours up to six hours than q.4 hours later on for 24 hours. It the carotid ultrasound or echocardiogram are positive will do further testing, otherwise, patient can go home and have repeat CT of the head again in two days to follow up as he has refused to have MRA of the brain.  2. Hypertension. Blood pressure slightly elevated. I have added beta blockers and patient can continue his Norvasc as well.  3. Hyperlipidemia. He is on statins.  4. Condition at this time is stable.   TIME SPENT: More than 60 minutes on history and physical.   Discussed the plan with patient and patient's wife in agreement for this plan.    ____________________________ Epifanio Lesches, MD sk:cms D: 12/26/2011 14:15:05 ET T: 12/26/2011 14:43:49 ET JOB#: [id number]  cc: Epifanio Lesches, MD, <Dictator> Richard L. Rosanna Randy, MD Epifanio Lesches MD ELECTRONICALLY SIGNED 01/03/2012 23:32

## 2014-12-25 DIAGNOSIS — N529 Male erectile dysfunction, unspecified: Secondary | ICD-10-CM | POA: Insufficient documentation

## 2014-12-25 DIAGNOSIS — M129 Arthropathy, unspecified: Secondary | ICD-10-CM | POA: Insufficient documentation

## 2014-12-25 DIAGNOSIS — I491 Atrial premature depolarization: Secondary | ICD-10-CM | POA: Insufficient documentation

## 2014-12-25 DIAGNOSIS — I6782 Cerebral ischemia: Secondary | ICD-10-CM | POA: Insufficient documentation

## 2014-12-25 DIAGNOSIS — N3281 Overactive bladder: Secondary | ICD-10-CM | POA: Insufficient documentation

## 2014-12-25 DIAGNOSIS — G939 Disorder of brain, unspecified: Secondary | ICD-10-CM | POA: Insufficient documentation

## 2014-12-25 DIAGNOSIS — E785 Hyperlipidemia, unspecified: Secondary | ICD-10-CM | POA: Insufficient documentation

## 2015-02-23 ENCOUNTER — Encounter: Payer: Self-pay | Admitting: Family Medicine

## 2015-02-23 ENCOUNTER — Ambulatory Visit (INDEPENDENT_AMBULATORY_CARE_PROVIDER_SITE_OTHER): Payer: Medicare Other | Admitting: Family Medicine

## 2015-02-23 VITALS — BP 108/54 | HR 64 | Temp 97.7°F | Resp 16 | Ht 73.0 in | Wt 207.0 lb

## 2015-02-23 DIAGNOSIS — I10 Essential (primary) hypertension: Secondary | ICD-10-CM

## 2015-02-23 DIAGNOSIS — K219 Gastro-esophageal reflux disease without esophagitis: Secondary | ICD-10-CM

## 2015-02-23 DIAGNOSIS — E785 Hyperlipidemia, unspecified: Secondary | ICD-10-CM | POA: Diagnosis not present

## 2015-02-23 NOTE — Progress Notes (Signed)
Patient ID: Adam Hanna., male   DOB: 1926/02/26, 81 y.o.   MRN: [id number]   Adam Hanna.  MRN: [id number] DOB: [birth year]  Subjective:  HPI   1. Essential hypertension Patient is an 81 year old male who presents for follow up of his hypertension.  His last visit was on 08/04/14.  His blood pressure at that time was 120/68.  He is currently on Nifedipine.  He has had a 20 pound weight loss and would like to see if he could come off of it due to his pressure being so good.  He reports good compliance and tolerance of the medication.  2. Gastroesophageal reflux disease, esophagitis presence not specified Patient is currently taking his Omeprazole twice daily.  He reports a 20 pound weight loss with significant dietary changes and feels he probably does not need to be taking this medication.  3. Hyperlipidemia The patient had his lipids checked on 02/04/14.  He is due for this level and is curious to have it checked due to the weight loss.  He is currently on Atorvastatin and reports no adverse effects of the medication.     Patient Active Problem List   Diagnosis Date Noted  . 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 Dyslipidemia 12/25/2014  . 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 Temporary cerebral vascular dysfunction 12/25/2014  . Acid reflux 10/19/2009  . BP (high blood pressure) 06/23/2008    Past Medical History  Diagnosis Date  . GERD (gastroesophageal reflux disease)   . Hyperlipidemia   . Hypertension     History   Social History  . Marital Status: Married    Spouse Name: N/A  . Number of Children: N/A  . Years of Education: N/A   Occupational History  . Not on file.   Social History Main Topics  . Smoking status: Former Smoker -- 1.00 packs/day for 20 years    Types: Cigarettes    Quit date: 08/21/1959  . Smokeless tobacco: Not on file  . Alcohol Use: 4.2 oz/week    7  Standard drinks or equivalent per week     Comment: 1 glass red wine daily  . Drug Use: No  . Sexual Activity: Not on file   Other Topics Concern  . Not on file   Social History Narrative    Outpatient Prescriptions Prior to Visit  Medication Sig Dispense Refill  . aspirin 81 MG EC tablet RA ASPIRIN EC, 81MG  (Oral Tablet Delayed Release)  1 Every Day for 0 days  Quantity: 0.00;  Refills: 0   Ordered :27-Jul-2010  Abran  ;  Started 02-Nov-2008 Active    . atorvastatin (LIPITOR) 20 MG tablet Take by mouth.    . Multiple Vitamins-Minerals (MULTIVITAMIN ADULT PO) MULTIVITAMINS (Oral Tablet)  1 Every Day for 0 days  Quantity: 0.00;  Refills: 0   Ordered :27-Jul-2010  Doy Hutching ;  Started 06-Jan-2009 Active    . Naproxen Sodium 220 MG CAPS Take by mouth.    Marland Kitchen NIFEdipine (PROCARDIA-XL/ADALAT-CC/NIFEDICAL-XL) 30 MG 24 hr tablet Take by mouth.    Marland Kitchen omeprazole (PRILOSEC) 20 MG capsule Take by mouth.     No facility-administered medications prior to visit.    No Known Allergies  Review of Systems  Constitutional: Negative.   Respiratory: Negative.   Cardiovascular: Negative.   Neurological: Negative for headaches.   Objective:  BP 108/54 mmHg  Pulse 64  Temp(Src) 97.7 F (36.5 C) (  Oral)  Resp 16  Ht 6\' 1"  (1.854 m)  Wt 207 lb (93.895 kg)  BMI 27.32 kg/m2  Physical Exam  Constitutional: He is oriented to person, place, and time and well-developed, well-nourished, and in no distress.  HENT:  Head: Normocephalic and atraumatic.  Right Ear: External ear normal.  Left Ear: External ear normal.  Nose: Nose normal.  Eyes: Conjunctivae and EOM are normal. Pupils are equal, round, and reactive to light.  Neck: Normal range of motion. Neck supple.  Cardiovascular: Normal rate, regular rhythm and normal heart sounds.   Pulmonary/Chest: Effort normal and breath sounds normal.  Abdominal: Soft. Bowel sounds are normal.  Neurological: He is alert and oriented to person, place,  and time. Gait normal.  Skin: Skin is warm and dry.  Psychiatric: Mood, memory, affect and judgment normal.    Assessment and Plan :   1. Essential hypertension Patient has had weight loss and has made dietary changes.  He states he does feel more tired since his weight loss.  Patient is going to try coming off of the Nifedipine.  He will check his blood pressure readings and bring in those readings on his next visit.  He will wean off of the medication over the next week.  We will see him back in 2 months just to make sure he is doing well off of the medication.  2. Gastroesophageal reflux disease, esophagitis presence not specified Patient has had significant weight loss with dietary changes.  He is to decrease his Omeprazole from BID to daily for the month of July.  In August he can go to M-W-F.  If he continues to be asymptomatic he will discontinue use at the end of August.  3. Hyperlipidemia  4. Intentional weight loss Ratio is lost 20 pounds with watching his diet. We'll see how his blood pressure does with his weight loss. He is to check blood pressure at home daily and bring in these readings in 2 months.   Miguel Aschoff MD [facility] 02/23/2015 11:33 AM

## 2015-02-26 DIAGNOSIS — I10 Essential (primary) hypertension: Secondary | ICD-10-CM | POA: Diagnosis not present

## 2015-02-26 DIAGNOSIS — E785 Hyperlipidemia, unspecified: Secondary | ICD-10-CM | POA: Diagnosis not present

## 2015-02-27 ENCOUNTER — Telehealth: Payer: Self-pay | Admitting: Family Medicine

## 2015-02-27 LAB — COMPREHENSIVE METABOLIC PANEL
ALT: 23 IU/L (ref 0–44)
AST: 29 IU/L (ref 0–40)
Albumin/Globulin Ratio: 1.9 (ref 1.1–2.5)
Albumin: 4.2 g/dL (ref 3.5–4.7)
Alkaline Phosphatase: 100 IU/L (ref 39–117)
BUN / CREAT RATIO: 18 (ref 10–22)
BUN: 19 mg/dL (ref 8–27)
Bilirubin Total: 0.6 mg/dL (ref 0.0–1.2)
CO2: 27 mmol/L (ref 18–29)
Calcium: 9.1 mg/dL (ref 8.6–10.2)
Chloride: 102 mmol/L (ref 97–108)
Creatinine, Ser: 1.03 mg/dL (ref 0.76–1.27)
GFR, EST AFRICAN AMERICAN: 76 mL/min/{1.73_m2} (ref >59)
GFR, EST NON AFRICAN AMERICAN: 65 mL/min/{1.73_m2} (ref >59)
GLUCOSE: 99 mg/dL (ref 65–99)
Globulin, Total: 2.2 g/dL (ref 1.5–4.5)
Potassium: 5 mmol/L (ref 3.5–5.2)
Sodium: 143 mmol/L (ref 134–144)
Total Protein: 6.4 g/dL (ref 6.0–8.5)

## 2015-02-27 LAB — LIPID PANEL WITH LDL/HDL RATIO
CHOLESTEROL TOTAL: 148 mg/dL (ref 100–199)
HDL: 43 mg/dL (ref >39)
LDL CALC: 79 mg/dL (ref 0–99)
LDL/HDL RATIO: 1.8 ratio (ref 0.0–3.6)
TRIGLYCERIDES: 132 mg/dL (ref 0–149)
VLDL CHOLESTEROL CAL: 26 mg/dL (ref 5–40)

## 2015-02-27 LAB — TSH: TSH: 2.01 u[IU]/mL (ref 0.450–4.500)

## 2015-02-27 NOTE — Telephone Encounter (Signed)
Labs reviewed but I did not clinical messaging the patient. Labs are stable.

## 2015-03-22 DIAGNOSIS — L821 Other seborrheic keratosis: Secondary | ICD-10-CM | POA: Diagnosis not present

## 2015-03-22 DIAGNOSIS — D1800 Hemangioma unspecified site: Secondary | ICD-10-CM | POA: Diagnosis not present

## 2015-03-22 DIAGNOSIS — D692 Other nonthrombocytopenic purpura: Secondary | ICD-10-CM | POA: Diagnosis not present

## 2015-03-22 DIAGNOSIS — L570 Actinic keratosis: Secondary | ICD-10-CM | POA: Diagnosis not present

## 2015-03-22 DIAGNOSIS — Z1283 Encounter for screening for malignant neoplasm of skin: Secondary | ICD-10-CM | POA: Diagnosis not present

## 2015-03-22 DIAGNOSIS — L820 Inflamed seborrheic keratosis: Secondary | ICD-10-CM | POA: Diagnosis not present

## 2015-03-22 DIAGNOSIS — D229 Melanocytic nevi, unspecified: Secondary | ICD-10-CM | POA: Diagnosis not present

## 2015-03-22 DIAGNOSIS — Z85820 Personal history of malignant melanoma of skin: Secondary | ICD-10-CM | POA: Diagnosis not present

## 2015-03-22 DIAGNOSIS — D485 Neoplasm of uncertain behavior of skin: Secondary | ICD-10-CM | POA: Diagnosis not present

## 2015-03-22 DIAGNOSIS — L578 Other skin changes due to chronic exposure to nonionizing radiation: Secondary | ICD-10-CM | POA: Diagnosis not present

## 2015-04-27 ENCOUNTER — Ambulatory Visit: Payer: Medicare Other | Admitting: Family Medicine

## 2015-06-10 DIAGNOSIS — Z23 Encounter for immunization: Secondary | ICD-10-CM | POA: Diagnosis not present

## 2015-09-22 DIAGNOSIS — L578 Other skin changes due to chronic exposure to nonionizing radiation: Secondary | ICD-10-CM | POA: Diagnosis not present

## 2015-09-22 DIAGNOSIS — L821 Other seborrheic keratosis: Secondary | ICD-10-CM | POA: Diagnosis not present

## 2015-09-22 DIAGNOSIS — D1800 Hemangioma unspecified site: Secondary | ICD-10-CM | POA: Diagnosis not present

## 2015-09-22 DIAGNOSIS — Z1283 Encounter for screening for malignant neoplasm of skin: Secondary | ICD-10-CM | POA: Diagnosis not present

## 2015-09-22 DIAGNOSIS — D229 Melanocytic nevi, unspecified: Secondary | ICD-10-CM | POA: Diagnosis not present

## 2015-09-22 DIAGNOSIS — Z85828 Personal history of other malignant neoplasm of skin: Secondary | ICD-10-CM | POA: Diagnosis not present

## 2015-09-22 DIAGNOSIS — L812 Freckles: Secondary | ICD-10-CM | POA: Diagnosis not present

## 2015-09-22 DIAGNOSIS — L820 Inflamed seborrheic keratosis: Secondary | ICD-10-CM | POA: Diagnosis not present

## 2015-09-22 DIAGNOSIS — Z85820 Personal history of malignant melanoma of skin: Secondary | ICD-10-CM | POA: Diagnosis not present

## 2015-09-22 DIAGNOSIS — D485 Neoplasm of uncertain behavior of skin: Secondary | ICD-10-CM | POA: Diagnosis not present

## 2015-10-27 ENCOUNTER — Encounter: Payer: Self-pay | Admitting: *Deleted

## 2015-12-29 ENCOUNTER — Encounter: Payer: Self-pay | Admitting: Family Medicine

## 2016-01-25 ENCOUNTER — Other Ambulatory Visit: Payer: Self-pay | Admitting: Family Medicine

## 2016-02-21 ENCOUNTER — Other Ambulatory Visit: Payer: Self-pay | Admitting: Family Medicine

## 2016-02-22 ENCOUNTER — Emergency Department: Payer: Medicare Other

## 2016-02-22 ENCOUNTER — Emergency Department
Admission: EM | Admit: 2016-02-22 | Discharge: 2016-02-22 | Disposition: A | Discharge status: Home / Self Care | Payer: Medicare Other | Attending: Emergency Medicine | Admitting: Emergency Medicine

## 2016-02-22 ENCOUNTER — Encounter: Payer: Self-pay | Admitting: Urgent Care

## 2016-02-22 DIAGNOSIS — Z7982 Long term (current) use of aspirin: Secondary | ICD-10-CM | POA: Diagnosis not present

## 2016-02-22 DIAGNOSIS — R079 Chest pain, unspecified: Secondary | ICD-10-CM

## 2016-02-22 DIAGNOSIS — I10 Essential (primary) hypertension: Secondary | ICD-10-CM

## 2016-02-22 DIAGNOSIS — Z79899 Other long term (current) drug therapy: Secondary | ICD-10-CM | POA: Insufficient documentation

## 2016-02-22 DIAGNOSIS — Z87891 Personal history of nicotine dependence: Secondary | ICD-10-CM | POA: Insufficient documentation

## 2016-02-22 DIAGNOSIS — R0789 Other chest pain: Secondary | ICD-10-CM | POA: Insufficient documentation

## 2016-02-22 DIAGNOSIS — E785 Hyperlipidemia, unspecified: Secondary | ICD-10-CM | POA: Diagnosis not present

## 2016-02-22 LAB — BASIC METABOLIC PANEL
ANION GAP: 6 (ref 5–15)
BUN: 23 mg/dL — AB (ref 6–20)
CHLORIDE: 105 mmol/L (ref 101–111)
CO2: 29 mmol/L (ref 22–32)
Calcium: 9.1 mg/dL (ref 8.9–10.3)
Creatinine, Ser: 1.06 mg/dL (ref 0.61–1.24)
GFR calc Af Amer: >60 mL/min (ref >60)
GLUCOSE: 111 mg/dL — AB (ref 65–99)
POTASSIUM: 4.3 mmol/L (ref 3.5–5.1)
Sodium: 140 mmol/L (ref 135–145)

## 2016-02-22 LAB — CBC
HEMATOCRIT: 42.2 % (ref 40.0–52.0)
HEMOGLOBIN: 14.3 g/dL (ref 13.0–18.0)
MCH: 30.2 pg (ref 26.0–34.0)
MCHC: 33.9 g/dL (ref 32.0–36.0)
MCV: 89 fL (ref 80.0–100.0)
Platelets: 193 10*3/uL (ref 150–440)
RBC: 4.74 MIL/uL (ref 4.40–5.90)
RDW: 13.9 % (ref 11.5–14.5)
WBC: 7.5 10*3/uL (ref 3.8–10.6)

## 2016-02-22 LAB — TROPONIN I
Troponin I: <0.03 ng/mL (ref <0.03)
Troponin I: <0.03 ng/mL (ref <0.03)

## 2016-02-22 NOTE — ED Provider Notes (Signed)
[facility] Emergency Department Provider Note   ____________________________________________  Time seen: Approximately 215 AM  I have reviewed the triage vital signs and the nursing notes.   HISTORY  Chief Complaint Chest Pain    HPI Adam Butzlaff. is a 82 y.o. male who comes into the hospital today with some chest discomfort and elevated blood pressure. The patient reports he had some tightness around his chest at midnight. He took his blood pressure 3 times and it was in the 150s over 100, 160s over 90 and then 170s over 102. He reports that he became anxious and he decided to come in to get checked out.The patient reports that when he arrived to the hospital he felt less anxious. He reports that he takes nifedipine every day but he is been running out so he skipped a dose today. He reports he is also been taking his nifedipine every other day to try to help it to last. He's been feeling a little nauseous yesterday and today and just not himself. He denies any abdominal pain, shortness of breath, sweats or vomiting. The patient reports that he contacted [facility] and has everything worked out with his medication that he should obtain it and still be able to continue taking his medications every day. The patient reports this pain is gone at this time.   Past Medical History  Diagnosis Date  . GERD (gastroesophageal reflux disease)   . Hyperlipidemia   . Hypertension     Patient Active Problem List   Diagnosis Date Noted  . 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 Dyslipidemia 12/25/2014  . 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 Temporary cerebral vascular dysfunction 12/25/2014  . Acid reflux 10/19/2009  . BP (high blood pressure) 06/23/2008    Past Surgical History  Procedure Laterality Date  . Cyst removal neck    . Melanoma excision    . Cataract extraction  Bilateral     Current Outpatient Rx  Name  Route  Sig  Dispense  Refill  . aspirin EC 81 MG tablet   Oral   Take 81 mg by mouth daily.         Marland Kitchen atorvastatin (LIPITOR) 20 MG tablet   Oral   Take 20 mg by mouth daily.         . Multiple Vitamin (MULTIVITAMIN WITH MINERALS) TABS tablet   Oral   Take 1 tablet by mouth daily.         . Naproxen Sodium 220 MG CAPS   Oral   Take 1 capsule by mouth 2 (two) times daily.          Marland Kitchen NIFEdipine (PROCARDIA-XL/ADALAT-CC/NIFEDICAL-XL) 30 MG 24 hr tablet   Oral   Take 30 mg by mouth daily.         Marland Kitchen omeprazole (PRILOSEC) 20 MG capsule   Oral   Take 20 mg by mouth daily.            Allergies Review of patient's allergies indicates no known allergies.  Family History  Problem Relation Age of Onset  . Congestive Heart Failure Mother   . Dementia Father   . Other Sister     Suicide by gunshot wound    Social History Social History  Substance Use Topics  . Smoking status: Former Smoker -- 1.00 packs/day for 20 years    Types: Cigarettes    Quit date: 08/21/1959  . Smokeless  tobacco: None  . Alcohol Use: 4.2 oz/week    7 Standard drinks or equivalent per week     Comment: 1 glass red wine daily    Review of Systems Constitutional: No fever/chills Eyes: No visual changes. ENT: No sore throat. Cardiovascular:  chest pain. Respiratory: Denies shortness of breath. Gastrointestinal: No abdominal pain.  No nausea, no vomiting.  No diarrhea.  No constipation. Genitourinary: Negative for dysuria. Musculoskeletal: Negative for back pain. Skin: Negative for rash. Neurological: Negative for headaches, focal weakness or numbness.  10-point ROS otherwise negative.  ____________________________________________   PHYSICAL EXAM:  VITAL SIGNS: ED Triage Vitals  Enc Vitals Group     BP 02/22/16 0111 189/84 mmHg     Pulse Rate 02/22/16 0111 68     Resp 02/22/16 0111 14     Temp 02/22/16 0111 97.8 F (36.6 C)      Temp Source 02/22/16 0111 Oral     SpO2 02/22/16 0111 95 %     Weight 02/22/16 0111 205 lb (92.987 kg)     Height 02/22/16 0111 6\' 1"  (1.854 m)     Head Cir --      Peak Flow --      Pain Score 02/22/16 0113 5     Pain Loc --      Pain Edu? --      Excl. in [place]? --     Constitutional: Alert and oriented. Well appearing and in no acute distress. Eyes: Conjunctivae are normal. PERRL. EOMI. Head: Atraumatic. Nose: No congestion/rhinnorhea. Mouth/Throat: Mucous membranes are moist.  Oropharynx non-erythematous. Cardiovascular: Normal rate, regular rhythm. Grossly normal heart sounds.  Good peripheral circulation. Respiratory: Normal respiratory effort.  No retractions. Lungs CTAB. Gastrointestinal: Soft and nontender. No distention. Positive bowel sounds Musculoskeletal: No lower extremity tenderness nor edema.  Neurologic:  Normal speech and language.  Skin:  Skin is warm, dry and intact. Marland Kitchen Psychiatric: Mood and affect are normal.   ____________________________________________   LABS (all labs ordered are listed, but only abnormal results are displayed)  Labs Reviewed  BASIC METABOLIC PANEL - Abnormal; Notable for the following:    Glucose, Bld 111 (*)    BUN 23 (*)    All other components within normal limits  CBC  TROPONIN I  TROPONIN I   ____________________________________________  EKG  ED ECG REPORT I, Loney Hering, the attending physician, personally viewed and interpreted this ECG.   Date: 02/22/2016  EKG Time: 107  Rate: 68  Rhythm: normal sinus rhythm  Axis: normal  Intervals:none  ST&T Change: none  ED ECG REPORT #2 I, Loney Hering, the attending physician, personally viewed and interpreted this ECG.   Date: 02/22/2016  EKG Time: 111  Rate: 69  Rhythm: normal sinus rhythm  Axis: normal  Intervals:none  ST&T Change: none   ____________________________________________  RADIOLOGY  CXR: No acute pulmonary process, mild aortic  atherosclerosis. ____________________________________________   PROCEDURES  Procedure(s) performed: None  Procedures  Critical Care performed: No  ____________________________________________   INITIAL IMPRESSION / ASSESSMENT AND PLAN / ED COURSE  Pertinent labs & imaging results that were available during my care of the patient were reviewed by me and considered in my medical decision making (see chart for details).  This is an 82 year old male who comes into the hospital today with some chest discomfort and elevated blood pressure. When I did go into the patient's room his blood pressure was in the 130s over 70s. His initial blood work is  unremarkable and he is not in any pain at this time. I will repeat the patient's troponin and reassess the patient. At that time all disposition the patient.  The patient's repeat troponin is unremarkable. He'll be discharged home to follow-up with his primary care physician. His chest pain has not returned. ____________________________________________   FINAL CLINICAL IMPRESSION(S) / ED DIAGNOSES  Final diagnoses:  Chest pain, unspecified chest pain type  Essential hypertension      NEW MEDICATIONS STARTED DURING THIS VISIT:  New Prescriptions   No medications on file     Note:  This document was prepared using Dragon voice recognition software and may include unintentional dictation errors.    Loney Hering, MD 02/22/16 [phone]

## 2016-02-22 NOTE — ED Notes (Signed)
Patient presents with c/o chest tightness with (+) intermittent radiation into LUE. Symptoms woke patient from sleep x 2 last night; last episode started at midnight. (+) nausea x 2 days. Patient has been monitoring blood pressures; 150-170s/90-110s.  Denies SOB and diaphoresis.

## 2016-02-22 NOTE — ED Notes (Signed)
Pt standing beside bed using urinal. Pt denies dizziness or weakness. Pt requested this RN not stay in room while urinating.

## 2016-02-22 NOTE — Discharge Instructions (Signed)
Hypertension °Hypertension, commonly called high blood pressure, is when the force of blood pumping through your arteries is too strong. Your arteries are the blood vessels that carry blood from your heart throughout your body. A blood pressure reading consists of a higher number over a lower number, such as 110/72. The higher number (systolic) is the pressure inside your arteries when your heart pumps. The lower number (diastolic) is the pressure inside your arteries when your heart relaxes. Ideally you want your blood pressure below 120/80. °Hypertension forces your heart to work harder to pump blood. Your arteries may become narrow or stiff. Having untreated or uncontrolled hypertension can cause heart attack, stroke, kidney disease, and other problems. °RISK FACTORS °Some risk factors for high blood pressure are controllable. Others are not.  °Risk factors you cannot control include:  °· Race. You may be at higher risk if you are African American. °· Age. Risk increases with age. °· Gender. Men are at higher risk than women before age 45 years. After age 65, women are at higher risk than men. °Risk factors you can control include: °· Not getting enough exercise or physical activity. °· Being overweight. °· Getting too much fat, sugar, calories, or salt in your diet. °· Drinking too much alcohol. °SIGNS AND SYMPTOMS °Hypertension does not usually cause signs or symptoms. Extremely high blood pressure (hypertensive crisis) may cause headache, anxiety, shortness of breath, and nosebleed. °DIAGNOSIS °To check if you have hypertension, your health care provider will measure your blood pressure while you are seated, with your arm held at the level of your heart. It should be measured at least twice using the same arm. Certain conditions can cause a difference in blood pressure between your right and left arms. A blood pressure reading that is higher than normal on one occasion does not mean that you need treatment. If  it is not clear whether you have high blood pressure, you may be asked to return on a different day to have your blood pressure checked again. Or, you may be asked to monitor your blood pressure at home for 1 or more weeks. °TREATMENT °Treating high blood pressure includes making lifestyle changes and possibly taking medicine. Living a healthy lifestyle can help lower high blood pressure. You may need to change some of your habits. °Lifestyle changes may include: °· Following the DASH diet. This diet is high in fruits, vegetables, and whole grains. It is low in salt, red meat, and added sugars. °· Keep your sodium intake below 2,300 mg per day. °· Getting at least 30-45 minutes of aerobic exercise at least 4 times per week. °· Losing weight if necessary. °· Not smoking. °· Limiting alcoholic beverages. °· Learning ways to reduce stress. °Your health care provider may prescribe medicine if lifestyle changes are not enough to get your blood pressure under control, and if one of the following is true: °· You are 18-59 years of age and your systolic blood pressure is above 140. °· You are 60 years of age or older, and your systolic blood pressure is above 150. °· Your diastolic blood pressure is above 90. °· You have diabetes, and your systolic blood pressure is over 140 or your diastolic blood pressure is over 90. °· You have kidney disease and your blood pressure is above 140/90. °· You have heart disease and your blood pressure is above 140/90. °Your personal target blood pressure may vary depending on your medical conditions, your age, and other factors. °HOME CARE INSTRUCTIONS °·   Have your blood pressure rechecked as directed by your health care provider.   °· Take medicines only as directed by your health care provider. Follow the directions carefully. Blood pressure medicines must be taken as prescribed. The medicine does not work as well when you skip doses. Skipping doses also puts you at risk for  problems. °· Do not smoke.   °· Monitor your blood pressure at home as directed by your health care provider.  °SEEK MEDICAL CARE IF:  °· You think you are having a reaction to medicines taken. °· You have recurrent headaches or feel dizzy. °· You have swelling in your ankles. °· You have trouble with your vision. °SEEK IMMEDIATE MEDICAL CARE IF: °· You develop a severe headache or confusion. °· You have unusual weakness, numbness, or feel faint. °· You have severe chest or abdominal pain. °· You vomit repeatedly. °· You have trouble breathing. °MAKE SURE YOU:  °· Understand these instructions. °· Will watch your condition. °· Will get help right away if you are not doing well or get worse. °  °This information is not intended to replace advice given to you by your health care provider. Make sure you discuss any questions you have with your health care provider. °  °Document Released: 08/07/2005 Document Revised: 12/22/2014 Document Reviewed: 05/30/2013 °Elsevier Interactive Patient Education ©2016 [employer] ° °Nonspecific Chest Pain  °Chest pain can be caused by many different conditions. There is always a chance that your pain could be related to something serious, such as a heart attack or a blood clot in your lungs. Chest pain can also be caused by conditions that are not life-threatening. If you have chest pain, it is very important to follow up with your health care provider. °CAUSES  °Chest pain can be caused by: °· Heartburn. °· Pneumonia or bronchitis. °· Anxiety or stress. °· Inflammation around your heart (pericarditis) or lung (pleuritis or pleurisy). °· A blood clot in your lung. °· A collapsed lung (pneumothorax). It can develop suddenly on its own (spontaneous pneumothorax) or from trauma to the chest. °· Shingles infection (varicella-zoster virus). °· Heart attack. °· Damage to the bones, muscles, and cartilage that make up your chest wall. This can include: °¨ Bruised bones due to  injury. °¨ Strained muscles or cartilage due to frequent or repeated coughing or overwork. °¨ Fracture to one or more ribs. °¨ Sore cartilage due to inflammation (costochondritis). °RISK FACTORS  °Risk factors for chest pain may include: °· Activities that increase your risk for trauma or injury to your chest. °· Respiratory infections or conditions that cause frequent coughing. °· Medical conditions or overeating that can cause heartburn. °· Heart disease or family history of heart disease. °· Conditions or health behaviors that increase your risk of developing a blood clot. °· Having had chicken pox (varicella zoster). °SIGNS AND SYMPTOMS °Chest pain can feel like: °· Burning or tingling on the surface of your chest or deep in your chest. °· Crushing, pressure, aching, or squeezing pain. °· Dull or sharp pain that is worse when you move, cough, or take a deep breath. °· Pain that is also felt in your back, neck, shoulder, or arm, or pain that spreads to any of these areas. °Your chest pain may come and go, or it may stay constant. °DIAGNOSIS °Lab tests or other studies may be needed to find the cause of your pain. Your health care provider may have you take a test called an ambulatory ECG (electrocardiogram). An ECG records   your heartbeat patterns at the time the test is performed. You may also have other tests, such as: °· Transthoracic echocardiogram (TTE). During echocardiography, sound waves are used to create a picture of all of the heart structures and to look at how blood flows through your heart. °· Transesophageal echocardiogram (TEE). This is a more advanced imaging test that obtains images from inside your body. It allows your health care provider to see your heart in finer detail. °· Cardiac monitoring. This allows your health care provider to monitor your heart rate and rhythm in real time. °· Holter monitor. This is a portable device that records your heartbeat and can help to diagnose abnormal  heartbeats. It allows your health care provider to track your heart activity for several days, if needed. °· Stress tests. These can be done through exercise or by taking medicine that makes your heart beat more quickly. °· Blood tests. °· Imaging tests. °TREATMENT  °Your treatment depends on what is causing your chest pain. Treatment may include: °· Medicines. These may include: °¨ Acid blockers for heartburn. °¨ Anti-inflammatory medicine. °¨ Pain medicine for inflammatory conditions. °¨ Antibiotic medicine, if an infection is present. °¨ Medicines to dissolve blood clots. °¨ Medicines to treat coronary artery disease. °· Supportive care for conditions that do not require medicines. This may include: °¨ Resting. °¨ Applying heat or cold packs to injured areas. °¨ Limiting activities until pain decreases. °HOME CARE INSTRUCTIONS °· If you were prescribed an antibiotic medicine, finish it all even if you start to feel better. °· Avoid any activities that bring on chest pain. °· Do not use any tobacco products, including cigarettes, chewing tobacco, or electronic cigarettes. If you need help quitting, ask your health care provider. °· Do not drink alcohol. °· Take medicines only as directed by your health care provider. °· Keep all follow-up visits as directed by your health care provider. This is important. This includes any further testing if your chest pain does not go away. °· If heartburn is the cause for your chest pain, you may be told to keep your head raised (elevated) while sleeping. This reduces the chance that acid will go from your stomach into your esophagus. °· Make lifestyle changes as directed by your health care provider. These may include: °¨ Getting regular exercise. Ask your health care provider to suggest some activities that are safe for you. °¨ Eating a heart-healthy diet. A registered dietitian can help you to learn healthy eating options. °¨ Maintaining a healthy weight. °¨ Managing  diabetes, if necessary. °¨ Reducing stress. °SEEK MEDICAL CARE IF: °· Your chest pain does not go away after treatment. °· You have a rash with blisters on your chest. °· You have a fever. °SEEK IMMEDIATE MEDICAL CARE IF:  °· Your chest pain is worse. °· You have an increasing cough, or you cough up blood. °· You have severe abdominal pain. °· You have severe weakness. °· You faint. °· You have chills. °· You have sudden, unexplained chest discomfort. °· You have sudden, unexplained discomfort in your arms, back, neck, or jaw. °· You have shortness of breath at any time. °· You suddenly start to sweat, or your skin gets clammy. °· You feel nauseous or you vomit. °· You suddenly feel light-headed or dizzy. °· Your heart begins to beat quickly, or it feels like it is skipping beats. °These symptoms may represent a serious problem that is an emergency. Do not wait to see if the symptoms will go   away. Get medical help right away. Call your local emergency services (911 in the [place] Do not drive yourself to the hospital. °  °This information is not intended to replace advice given to you by your health care provider. Make sure you discuss any questions you have with your health care provider. °  °Document Released: 05/17/2005 Document Revised: 08/28/2014 Document Reviewed: 03/13/2014 °Elsevier Interactive Patient Education ©2016 Elsevier Inc. ° °

## 2016-02-24 ENCOUNTER — Encounter: Payer: Self-pay | Admitting: Family Medicine

## 2016-02-24 ENCOUNTER — Ambulatory Visit (INDEPENDENT_AMBULATORY_CARE_PROVIDER_SITE_OTHER): Payer: Medicare Other | Admitting: Family Medicine

## 2016-02-24 VITALS — BP 122/68 | HR 74 | Temp 97.9°F | Resp 16 | Wt 206.0 lb

## 2016-02-24 DIAGNOSIS — R079 Chest pain, unspecified: Secondary | ICD-10-CM | POA: Insufficient documentation

## 2016-02-24 MED ORDER — NITROGLYCERIN 0.4 MG SL SUBL: 0.4000 mg | SUBLINGUAL_TABLET | SUBLINGUAL | Status: DC | PRN

## 2016-02-24 NOTE — Progress Notes (Signed)
Patient: Adam Hanna. Male    DOB: 04/13/1926   82 y.o.   MRN: [id number] Visit Date: 02/24/2016  Today's Provider: Wilhemena Durie, MD   Chief Complaint  Patient presents with  . Hospitalization Follow-up   Subjective:    HPI     Follow up ER visit  Patient was seen in ER for chest pain and elevated BP on 02/22/2016. He was treated for chest pain and HTN. Pt states he was very active the previous day, and was doing heavy lifting above his head. Woke up around midnight with chest tightness reaching from left shoulder to right shoulder. BP was elevated when he was experiencing the chest discomfort. BP was 171/102 at that time. Denies have other MI sx at that time. Treatment for this included labs (stable; troponin was negative), EKG (negative) and CXR (no acute pulmonary process, mild aortic atherosclerosis). He reports good compliance with treatment. He reports this condition is Improved.   ------------------------------------------------------------------------------------    No Known Allergies Current Meds  Medication Sig  . aspirin EC 81 MG tablet Take 81 mg by mouth daily.  Marland Kitchen atorvastatin (LIPITOR) 20 MG tablet Take 20 mg by mouth daily.  . Multiple Vitamin (MULTIVITAMIN WITH MINERALS) TABS tablet Take 1 tablet by mouth daily.  . Naproxen Sodium 220 MG CAPS Take 1 capsule by mouth 2 (two) times daily.   Marland Kitchen NIFEdipine (PROCARDIA-XL/ADALAT-CC/NIFEDICAL-XL) 30 MG 24 hr tablet Take 30 mg by mouth daily.  Marland Kitchen omeprazole (PRILOSEC) 20 MG capsule Take 20 mg by mouth daily.     Review of Systems  Constitutional: Negative for fever, chills, diaphoresis, activity change, appetite change, fatigue and unexpected weight change.  Respiratory: Negative for chest tightness and shortness of breath.   Cardiovascular: Negative for chest pain, palpitations and leg swelling.    Social History  Substance Use Topics  . Smoking status: Former Smoker -- 1.00 packs/day for 20  years    Types: Cigarettes    Quit date: 08/21/1959  . Smokeless tobacco: Not on file  . Alcohol Use: 4.2 oz/week    7 Standard drinks or equivalent per week     Comment: 1 glass red wine daily   Objective:   BP 122/68 mmHg  Pulse 74  Temp(Src) 97.9 F (36.6 C) (Oral)  Resp 16  Wt 206 lb (93.441 kg)  SpO2 96%  Physical Exam  Constitutional: He is oriented to person, place, and time. He appears well-developed and well-nourished.  HENT:  Head: Normocephalic and atraumatic.  Neck: Neck supple.  Cardiovascular: Normal rate, regular rhythm and normal heart sounds.   Pulmonary/Chest: Effort normal and breath sounds normal. No respiratory distress.  Abdominal: Soft. There is no tenderness.  Neurological: He is alert and oriented to person, place, and time.  Skin: Skin is warm and dry.  Psychiatric: He has a normal mood and affect. His behavior is normal. Judgment and thought content normal.        Assessment & Plan:     1. Chest pain, unspecified chest pain type Improved.Patient ruled out for MI in ED. It is possible this is angina and patient has not seen cardiologist in greater than 13 years. Will refer to cardiology as below to assess. Will start Nitro as below PRN. - Ambulatory referral to Cardiology  - nitroGLYCERIN (NITROSTAT) 0.4 MG SL tablet; Place 1 tablet (0.4 mg total) under the tongue every 5 (five) minutes as needed for chest pain.  Dispense: 25 tablet; Refill: 3  2. Hypertension Good control 3. Hyperlipidemia Last LDL was 79. I have done the exam and reviewed the above chart and it is accurate to the best of my knowledge.  Patient seen and examined by Miguel Aschoff, MD, and note scribed by Renaldo Fiddler, CMA.   Richard Cranford Mon, MD  [facility]

## 2016-03-01 ENCOUNTER — Encounter: Payer: Medicare Other | Admitting: Family Medicine

## 2016-03-06 DIAGNOSIS — R110 Nausea: Secondary | ICD-10-CM | POA: Diagnosis not present

## 2016-03-06 DIAGNOSIS — S40862A Insect bite (nonvenomous) of left upper arm, initial encounter: Secondary | ICD-10-CM | POA: Diagnosis not present

## 2016-03-06 DIAGNOSIS — Z79899 Other long term (current) drug therapy: Secondary | ICD-10-CM | POA: Diagnosis not present

## 2016-03-06 DIAGNOSIS — D692 Other nonthrombocytopenic purpura: Secondary | ICD-10-CM | POA: Diagnosis not present

## 2016-03-06 DIAGNOSIS — R21 Rash and other nonspecific skin eruption: Secondary | ICD-10-CM | POA: Diagnosis not present

## 2016-03-06 DIAGNOSIS — R531 Weakness: Secondary | ICD-10-CM | POA: Diagnosis not present

## 2016-03-13 DIAGNOSIS — E782 Mixed hyperlipidemia: Secondary | ICD-10-CM | POA: Diagnosis not present

## 2016-03-13 DIAGNOSIS — I10 Essential (primary) hypertension: Secondary | ICD-10-CM | POA: Diagnosis not present

## 2016-03-13 DIAGNOSIS — I208 Other forms of angina pectoris: Secondary | ICD-10-CM | POA: Diagnosis not present

## 2016-03-15 ENCOUNTER — Ambulatory Visit (INDEPENDENT_AMBULATORY_CARE_PROVIDER_SITE_OTHER): Payer: Medicare Other | Admitting: Family Medicine

## 2016-03-15 ENCOUNTER — Encounter: Payer: Self-pay | Admitting: Family Medicine

## 2016-03-15 VITALS — BP 124/72 | HR 64 | Temp 98.2°F | Resp 12 | Ht 73.0 in | Wt 207.0 lb

## 2016-03-15 DIAGNOSIS — E785 Hyperlipidemia, unspecified: Secondary | ICD-10-CM

## 2016-03-15 DIAGNOSIS — Z0000 Encounter for general adult medical examination without abnormal findings: Secondary | ICD-10-CM

## 2016-03-15 DIAGNOSIS — R739 Hyperglycemia, unspecified: Secondary | ICD-10-CM | POA: Diagnosis not present

## 2016-03-15 DIAGNOSIS — G3184 Mild cognitive impairment, so stated: Secondary | ICD-10-CM

## 2016-03-15 DIAGNOSIS — I10 Essential (primary) hypertension: Secondary | ICD-10-CM

## 2016-03-15 MED ORDER — FISH OIL 1000 MG PO CAPS: ORAL_CAPSULE | ORAL | 0 refills | Status: DC

## 2016-03-15 NOTE — Progress Notes (Signed)
Patient: Adam Adamic., Male    DOB: May 12, 1926, 82 y.o.   MRN: [id number] Visit Date: 03/15/2016  Today's Provider: Wilhemena Durie, MD   Chief Complaint  Patient presents with  . Medicare Wellness   Subjective:   Adam Humbard. is a 82 y.o. male who presents today for his Subsequent Annual Wellness Visit. He feels well. He reports exercising goes to fitness class once a week, plays golf twice a week and walks other days. He reports he is sleeping well. He feels very well. He is starting to fill older. He is moving a little more slowly than normal. We discussed at length today follow-up on lipids and whether or not to continue his statin therapy. We'll wait till after his cardiology evaluation next week. Immunization History  Administered Date(s) Administered  . Pneumococcal Conjugate-13 02/04/2014  . Pneumococcal Polysaccharide-23 06/23/2005  . Tdap 07/31/2013  . Zoster 06/19/2006   Last colonoscopy 06/11/12 sessile serrated adenoma, no need to repeat this test again.   Review of Systems  Constitutional: Negative.   HENT: Negative.   Eyes: Negative.   Respiratory: Negative.   Cardiovascular: Negative.   Gastrointestinal: Negative.   Endocrine: Negative.   Genitourinary: Negative.   Musculoskeletal: Negative.   Skin: Negative.   Allergic/Immunologic: Negative.   Neurological: Negative.   Hematological: Negative.   Psychiatric/Behavioral: Negative.     Patient Active Problem List   Diagnosis Date Noted  . Chest pain 02/24/2016  . 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 Dyslipidemia 12/25/2014  . 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 Temporary cerebral vascular dysfunction 12/25/2014  . Acid reflux 10/19/2009  . BP (high blood pressure) 06/23/2008    Social History   Social History  . Marital status: Married    Spouse name: N/A  . Number of children: N/A  . Years of education:  N/A   Occupational History  . Not on file.   Social History Main Topics  . Smoking status: Former Smoker    Packs/day: 1.00    Years: 20.00    Types: Cigarettes    Quit date: 08/21/1959  . Smokeless tobacco: Never Used  . Alcohol use 4.2 oz/week    7 Standard drinks or equivalent per week     Comment: about 4 times a week 1 to 2 drinks at a time  . Drug use: No  . Sexual activity: Not on file   Other Topics Concern  . Not on file   Social History Narrative  . No narrative on file    Past Surgical History:  Procedure Laterality Date  . CATARACT EXTRACTION Bilateral   . CYST REMOVAL NECK    . MELANOMA EXCISION      His family history includes Congestive Heart Failure in his mother; Dementia in his father; Other in his sister.    Outpatient Medications Prior to Visit  Medication Sig Dispense Refill  . aspirin EC 81 MG tablet Take 81 mg by mouth daily.    Marland Kitchen atorvastatin (LIPITOR) 20 MG tablet Take 20 mg by mouth daily.    . Multiple Vitamin (MULTIVITAMIN WITH MINERALS) TABS tablet Take 1 tablet by mouth daily.    . Naproxen Sodium 220 MG CAPS Take 1 capsule by mouth 2 (two) times daily.     Marland Kitchen NIFEdipine (PROCARDIA-XL/ADALAT-CC/NIFEDICAL-XL) 30 MG 24 hr tablet Take 30 mg by mouth daily.    . nitroGLYCERIN (NITROSTAT) 0.4 MG SL tablet Place 1 tablet (0.4  mg total) under the tongue every 5 (five) minutes as needed for chest pain. 25 tablet 3  . omeprazole (PRILOSEC) 20 MG capsule Take 20 mg by mouth daily.      No facility-administered medications prior to visit.     No Known Allergies  Patient Care Team: Jerrol Banana., MD as PCP - General (Family Medicine)  Objective:   Vitals:  Vitals:   03/15/16 1015  BP: 124/72  Pulse: 64  Resp: 12  Temp: 98.2 F (36.8 C)  Weight: 207 lb (93.9 kg)  Height: 6\' 1"  (1.854 m)    Physical Exam  Constitutional: He is oriented to person, place, and time. He appears well-developed and well-nourished.  HENT:  Head:  Normocephalic and atraumatic.  Right Ear: External ear normal.  Left Ear: External ear normal.  Eyes: Conjunctivae are normal. Pupils are equal, round, and reactive to light.  Neck: Normal range of motion. Neck supple.  Cardiovascular: Normal rate, regular rhythm, normal heart sounds and intact distal pulses.   No murmur heard. Pulmonary/Chest: Effort normal and breath sounds normal. No respiratory distress. He has no wheezes.  Abdominal: Soft. He exhibits no distension. There is no tenderness.  Genitourinary: Penis normal. No penile tenderness.  Musculoskeletal: Normal range of motion.  Neurological: He is alert and oriented to person, place, and time.  Skin: Skin is warm and dry. No rash noted. No erythema.  Psychiatric: He has a normal mood and affect. His behavior is normal. Judgment and thought content normal.    Activities of Daily Living No flowsheet data found.  Fall Risk Assessment Fall Risk  03/15/2016  Falls in the past year? No     Depression Screen PHQ 2/9 Scores 03/15/2016  PHQ - 2 Score 0    Cognitive Testing - 6-CIT    Year: 0 4 points  Month: 0 3 points  Memorize "Pia Mau, [address] [place]"  Time (within 1 hour:) 0 3 points  Count backwards from 20: 0 2 4 points  Name months of year: 0 2 4 points  Repeat Address: 0 2 4 6 8 10  points   Total Score: 7/28  Interpretation : Normal (0-7) Abnormal (8-28)    Assessment & Plan:     Annual Wellness Visit  Reviewed patient's Family Medical History Reviewed and updated list of patient's medical providers Assessment of cognitive impairment was done Assessed patient's functional ability Established a written schedule for health screening [place] Completed and Reviewed 1. Medicare annual wellness visit, subsequent  2. Essential hypertension - Comprehensive metabolic panel - TSH  3. Dyslipidemia - Lipid Panel With LDL/HDL Ratio  4. MCI (mild cognitive impairment) Start  Fish Oil OTC. - TSH  5. Hyperglycemia - HgB A1c  Patient was seen and examined by Dr. Eulas Post and note was scribed by Adam Hanna, RMA.    Adam Aschoff MD [facility] 03/15/2016 10:18 AM

## 2016-03-15 NOTE — Patient Instructions (Signed)
Start Fish Oil 1 to 2 capsules daily.

## 2016-03-16 LAB — LIPID PANEL WITH LDL/HDL RATIO
Cholesterol, Total: 153 mg/dL (ref 100–199)
HDL: 45 mg/dL (ref >39)
LDL CALC: 85 mg/dL (ref 0–99)
LDL/HDL RATIO: 1.9 ratio (ref 0.0–3.6)
TRIGLYCERIDES: 115 mg/dL (ref 0–149)
VLDL CHOLESTEROL CAL: 23 mg/dL (ref 5–40)

## 2016-03-16 LAB — TSH: TSH: 1.8 u[IU]/mL (ref 0.450–4.500)

## 2016-03-16 LAB — COMPREHENSIVE METABOLIC PANEL
A/G RATIO: 1.6 (ref 1.2–2.2)
ALBUMIN: 4.3 g/dL (ref 3.5–4.7)
ALT: 27 IU/L (ref 0–44)
AST: 30 IU/L (ref 0–40)
Alkaline Phosphatase: 111 IU/L (ref 39–117)
BILIRUBIN TOTAL: 0.5 mg/dL (ref 0.0–1.2)
BUN / CREAT RATIO: 20 (ref 10–24)
BUN: 20 mg/dL (ref 8–27)
CHLORIDE: 100 mmol/L (ref 96–106)
CO2: 25 mmol/L (ref 18–29)
Calcium: 9.2 mg/dL (ref 8.6–10.2)
Creatinine, Ser: 1.01 mg/dL (ref 0.76–1.27)
GFR, EST AFRICAN AMERICAN: 77 mL/min/{1.73_m2} (ref >59)
GFR, EST NON AFRICAN AMERICAN: 67 mL/min/{1.73_m2} (ref >59)
Globulin, Total: 2.7 g/dL (ref 1.5–4.5)
Glucose: 95 mg/dL (ref 65–99)
POTASSIUM: 5.4 mmol/L — AB (ref 3.5–5.2)
Sodium: 142 mmol/L (ref 134–144)
TOTAL PROTEIN: 7 g/dL (ref 6.0–8.5)

## 2016-03-16 LAB — HEMOGLOBIN A1C
Est. average glucose Bld gHb Est-mCnc: 128 mg/dL
Hgb A1c MFr Bld: 6.1 % — ABNORMAL HIGH (ref 4.8–5.6)

## 2016-03-28 DIAGNOSIS — R072 Precordial pain: Secondary | ICD-10-CM | POA: Diagnosis not present

## 2016-03-28 DIAGNOSIS — I10 Essential (primary) hypertension: Secondary | ICD-10-CM | POA: Diagnosis not present

## 2016-03-28 DIAGNOSIS — E782 Mixed hyperlipidemia: Secondary | ICD-10-CM | POA: Diagnosis not present

## 2016-03-28 DIAGNOSIS — I208 Other forms of angina pectoris: Secondary | ICD-10-CM | POA: Diagnosis not present

## 2016-04-12 ENCOUNTER — Encounter: Payer: Self-pay | Admitting: Family Medicine

## 2016-04-12 NOTE — Telephone Encounter (Signed)
Please review-aa 

## 2016-04-30 ENCOUNTER — Encounter: Payer: Self-pay | Admitting: Family Medicine

## 2016-05-01 DIAGNOSIS — L570 Actinic keratosis: Secondary | ICD-10-CM | POA: Diagnosis not present

## 2016-05-01 DIAGNOSIS — Z85820 Personal history of malignant melanoma of skin: Secondary | ICD-10-CM | POA: Diagnosis not present

## 2016-05-01 DIAGNOSIS — L578 Other skin changes due to chronic exposure to nonionizing radiation: Secondary | ICD-10-CM | POA: Diagnosis not present

## 2016-05-01 DIAGNOSIS — L249 Irritant contact dermatitis, unspecified cause: Secondary | ICD-10-CM | POA: Diagnosis not present

## 2016-05-01 DIAGNOSIS — D485 Neoplasm of uncertain behavior of skin: Secondary | ICD-10-CM | POA: Diagnosis not present

## 2016-05-01 DIAGNOSIS — C44311 Basal cell carcinoma of skin of nose: Secondary | ICD-10-CM | POA: Diagnosis not present

## 2016-05-01 DIAGNOSIS — Z85828 Personal history of other malignant neoplasm of skin: Secondary | ICD-10-CM | POA: Diagnosis not present

## 2016-05-01 DIAGNOSIS — D229 Melanocytic nevi, unspecified: Secondary | ICD-10-CM | POA: Diagnosis not present

## 2016-05-01 DIAGNOSIS — Z1283 Encounter for screening for malignant neoplasm of skin: Secondary | ICD-10-CM | POA: Diagnosis not present

## 2016-05-01 DIAGNOSIS — L820 Inflamed seborrheic keratosis: Secondary | ICD-10-CM | POA: Diagnosis not present

## 2016-05-01 DIAGNOSIS — L812 Freckles: Secondary | ICD-10-CM | POA: Diagnosis not present

## 2016-05-01 DIAGNOSIS — D1800 Hemangioma unspecified site: Secondary | ICD-10-CM | POA: Diagnosis not present

## 2016-05-01 DIAGNOSIS — L304 Erythema intertrigo: Secondary | ICD-10-CM | POA: Diagnosis not present

## 2016-05-01 MED ORDER — OMEPRAZOLE 20 MG PO CPDR: 20.0000 mg | DELAYED_RELEASE_CAPSULE | Freq: Every day | ORAL | 3 refills | Status: DC

## 2016-05-17 DIAGNOSIS — C44311 Basal cell carcinoma of skin of nose: Secondary | ICD-10-CM | POA: Diagnosis not present

## 2016-06-08 DIAGNOSIS — Z23 Encounter for immunization: Secondary | ICD-10-CM | POA: Diagnosis not present

## 2016-06-20 ENCOUNTER — Encounter: Payer: Self-pay | Admitting: Family Medicine

## 2016-06-20 ENCOUNTER — Ambulatory Visit (INDEPENDENT_AMBULATORY_CARE_PROVIDER_SITE_OTHER): Payer: Medicare Other | Admitting: Family Medicine

## 2016-06-20 VITALS — BP 142/68 | HR 72 | Temp 97.7°F | Resp 16 | Wt 207.0 lb

## 2016-06-20 DIAGNOSIS — E785 Hyperlipidemia, unspecified: Secondary | ICD-10-CM

## 2016-06-20 DIAGNOSIS — R739 Hyperglycemia, unspecified: Secondary | ICD-10-CM | POA: Diagnosis not present

## 2016-06-20 DIAGNOSIS — I10 Essential (primary) hypertension: Secondary | ICD-10-CM | POA: Diagnosis not present

## 2016-06-20 NOTE — Progress Notes (Signed)
Subjective:  HPI  Lipid/Cholesterol, Follow-up:   Last seen for this 3 months ago.  Management changes since that visit include stopped statin. . Last Lipid Panel:    Component Value Date/Time   CHOL 153 03/15/2016 1057   CHOL 171 12/26/2011 0726   TRIG 115 03/15/2016 1057   TRIG 127 12/26/2011 0726   HDL 45 03/15/2016 1057   HDL 47 12/26/2011 0726   VLDL 25 12/26/2011 0726   LDLCALC 85 03/15/2016 1057   LDLCALC 99 12/26/2011 0726    He reports good compliance with treatment. He is not having side effects.  Current diet: increased fish in diet Current exercise: walking and golf  Wt Readings from Last 3 Encounters:  06/20/16 207 lb (93.9 kg)  03/15/16 207 lb (93.9 kg)  02/24/16 206 lb (93.4 kg)   Pt will rechecked labs off of statin.   -------------------------------------------------------------------  Hypertension, follow-up:  BP Readings from Last 3 Encounters:  06/20/16 (!) 142/68  03/15/16 124/72  02/24/16 122/68    He was last seen for hypertension 3 months ago.  BP at that visit was 124/72. Management since that visit includes none. He reports good compliance with treatment. He is not having side effects.  He is exercising. He is adherent to low salt diet.   Wt Readings from Last 3 Encounters:  06/20/16 207 lb (93.9 kg)  03/15/16 207 lb (93.9 kg)  02/24/16 206 lb (93.4 kg)    ------------------------------------------------------------------------    Prior to Admission medications   Medication Sig Start Date End Date Taking? Authorizing Provider  aspirin EC 81 MG tablet Take 81 mg by mouth daily.   Yes Historical Provider, MD  naproxen sodium (ANAPROX) 220 MG tablet Take 220 mg by mouth daily.   Yes Historical Provider, MD  NIFEdipine (PROCARDIA-XL/ADALAT-CC/NIFEDICAL-XL) 30 MG 24 hr tablet Take 30 mg by mouth daily.   Yes Historical Provider, MD  omeprazole (PRILOSEC) 20 MG capsule Take 1 capsule (20 mg total) by mouth daily. 05/01/16  Yes  Adam Hardenbrook Maceo Pro., MD    Patient Active Problem List   Diagnosis Date Noted  . Chest pain 02/24/2016  . 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 Dyslipidemia 12/25/2014  . 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 Temporary cerebral vascular dysfunction 12/25/2014  . Acid reflux 10/19/2009  . BP (high blood pressure) 06/23/2008    Past Medical History:  Diagnosis Date  . GERD (gastroesophageal reflux disease)   . Hyperlipidemia   . Hypertension     Social History   Social History  . Marital status: Married    Spouse name: N/A  . Number of children: N/A  . Years of education: N/A   Occupational History  . Not on file.   Social History Main Topics  . Smoking status: Former Smoker    Packs/day: 1.00    Years: 20.00    Types: Cigarettes    Quit date: 08/21/1959  . Smokeless tobacco: Never Used  . Alcohol use 4.2 oz/week    7 Standard drinks or equivalent per week     Comment: about 4 times a week 1 to 2 drinks at a time  . Drug use: No  . Sexual activity: Not on file   Other Topics Concern  . Not on file   Social History Narrative  . No narrative on file    No Known Allergies  Review of Systems  Constitutional: Negative.   HENT: Negative.  Eyes: Negative.   Respiratory: Negative.   Cardiovascular: Negative.   Gastrointestinal: Negative.   Genitourinary: Negative.   Musculoskeletal: Negative.   Skin: Negative.   Neurological: Negative.   Endo/Heme/Allergies: Negative.   Psychiatric/Behavioral: Negative.     Immunization History  Administered Date(s) Administered  . Pneumococcal Conjugate-13 02/04/2014  . Pneumococcal Polysaccharide-23 06/23/2005  . Tdap 07/31/2013  . Zoster 06/19/2006   Objective:  BP (!) 142/68 (BP Location: Left Arm, Patient Position: Sitting, Cuff Size: Normal)   Pulse 72   Temp 97.7 F (36.5 C) (Oral)   Resp 16   Wt 207 lb (93.9 kg)   BMI 27.31  kg/m   Physical Exam  Constitutional: He is well-developed, well-nourished, and in no distress.  Eyes: Conjunctivae and EOM are normal. Pupils are equal, round, and reactive to light.  Neck: Normal range of motion. Neck supple.  Cardiovascular: Normal rate, regular rhythm, normal heart sounds and intact distal pulses.   Musculoskeletal: Normal range of motion.  Skin: Skin is warm and dry.  Psychiatric: Mood, memory, affect and judgment normal.  Tip of nose is healing nicely from removal of a basal cell carcinoma  Lab Results  Component Value Date   WBC 7.5 02/22/2016   HGB 14.3 02/22/2016   HCT 42.2 02/22/2016   PLT 193 02/22/2016   GLUCOSE 95 03/15/2016   CHOL 153 03/15/2016   TRIG 115 03/15/2016   HDL 45 03/15/2016   LDLCALC 85 03/15/2016   TSH 1.800 03/15/2016   PSA 0.9 07/31/2013   HGBA1C 6.1 (H) 03/15/2016    CMP     Component Value Date/Time   NA 142 03/15/2016 1057   NA 143 12/26/2011 0746   K 5.4 (H) 03/15/2016 1057   K 4.2 12/26/2011 0746   CL 100 03/15/2016 1057   CL 105 12/26/2011 0746   CO2 25 03/15/2016 1057   CO2 32 12/26/2011 0746   GLUCOSE 95 03/15/2016 1057   GLUCOSE 111 (H) 02/22/2016 0118   GLUCOSE 105 (H) 12/26/2011 0746   BUN 20 03/15/2016 1057   BUN 12 12/26/2011 0746   CREATININE 1.01 03/15/2016 1057   CREATININE 0.96 12/26/2011 0746   CALCIUM 9.2 03/15/2016 1057   CALCIUM 9.0 12/26/2011 0746   PROT 7.0 03/15/2016 1057   PROT 7.5 12/26/2011 0746   ALBUMIN 4.3 03/15/2016 1057   ALBUMIN 4.1 12/26/2011 0746   AST 30 03/15/2016 1057   AST 27 12/26/2011 0746   ALT 27 03/15/2016 1057   ALT 34 12/26/2011 0746   ALKPHOS 111 03/15/2016 1057   ALKPHOS 91 12/26/2011 0746   BILITOT 0.5 03/15/2016 1057   BILITOT 0.5 12/26/2011 0746   GFRNONAA 67 03/15/2016 1057   GFRNONAA >60 12/26/2011 0746   GFRAA 77 03/15/2016 1057   GFRAA >60 12/26/2011 0746    Assessment and Plan :  1. Dyslipidemia Continue off statin. Check labs  - Lipid Panel With  LDL/HDL Ratio  2. Essential hypertension   3. Hyperglycemia  - Hemoglobin A1c   HPI, Exam, and A&P Transcribed under the direction and in the presence of [name], MD  Electronically Signed: [name], CMA I have done the exam and reviewed the above chart and it is accurate to the best of my knowledge.  Miguel Aschoff MD [facility] 06/20/2016 8:26 AM

## 2016-06-21 LAB — HEMOGLOBIN A1C
ESTIMATED AVERAGE GLUCOSE: 123 mg/dL
Hgb A1c MFr Bld: 5.9 % — ABNORMAL HIGH (ref 4.8–5.6)

## 2016-06-21 LAB — LIPID PANEL WITH LDL/HDL RATIO
CHOLESTEROL TOTAL: 249 mg/dL — AB (ref 100–199)
HDL: 42 mg/dL (ref >39)
LDL CALC: 178 mg/dL — AB (ref 0–99)
LDl/HDL Ratio: 4.2 ratio units — ABNORMAL HIGH (ref 0.0–3.6)
Triglycerides: 145 mg/dL (ref 0–149)
VLDL CHOLESTEROL CAL: 29 mg/dL (ref 5–40)

## 2016-10-03 DIAGNOSIS — I10 Essential (primary) hypertension: Secondary | ICD-10-CM | POA: Diagnosis not present

## 2016-10-03 DIAGNOSIS — E782 Mixed hyperlipidemia: Secondary | ICD-10-CM | POA: Diagnosis not present

## 2016-11-01 DIAGNOSIS — Z85828 Personal history of other malignant neoplasm of skin: Secondary | ICD-10-CM | POA: Diagnosis not present

## 2016-11-01 DIAGNOSIS — D1800 Hemangioma unspecified site: Secondary | ICD-10-CM | POA: Diagnosis not present

## 2016-11-01 DIAGNOSIS — L820 Inflamed seborrheic keratosis: Secondary | ICD-10-CM | POA: Diagnosis not present

## 2016-11-01 DIAGNOSIS — L578 Other skin changes due to chronic exposure to nonionizing radiation: Secondary | ICD-10-CM | POA: Diagnosis not present

## 2016-11-01 DIAGNOSIS — Z85820 Personal history of malignant melanoma of skin: Secondary | ICD-10-CM | POA: Diagnosis not present

## 2016-11-01 DIAGNOSIS — Z1283 Encounter for screening for malignant neoplasm of skin: Secondary | ICD-10-CM | POA: Diagnosis not present

## 2016-11-01 DIAGNOSIS — L570 Actinic keratosis: Secondary | ICD-10-CM | POA: Diagnosis not present

## 2016-11-01 DIAGNOSIS — D485 Neoplasm of uncertain behavior of skin: Secondary | ICD-10-CM | POA: Diagnosis not present

## 2016-11-01 DIAGNOSIS — L812 Freckles: Secondary | ICD-10-CM | POA: Diagnosis not present

## 2016-11-01 DIAGNOSIS — L821 Other seborrheic keratosis: Secondary | ICD-10-CM | POA: Diagnosis not present

## 2016-11-24 ENCOUNTER — Other Ambulatory Visit: Payer: Self-pay | Admitting: Family Medicine

## 2016-12-18 ENCOUNTER — Ambulatory Visit (INDEPENDENT_AMBULATORY_CARE_PROVIDER_SITE_OTHER): Payer: Medicare Other | Admitting: Family Medicine

## 2016-12-18 VITALS — BP 124/70 | HR 72 | Temp 98.0°F | Resp 14 | Wt 211.0 lb

## 2016-12-18 DIAGNOSIS — I10 Essential (primary) hypertension: Secondary | ICD-10-CM

## 2016-12-18 DIAGNOSIS — K219 Gastro-esophageal reflux disease without esophagitis: Secondary | ICD-10-CM

## 2016-12-18 DIAGNOSIS — H9313 Tinnitus, bilateral: Secondary | ICD-10-CM

## 2016-12-18 DIAGNOSIS — R5383 Other fatigue: Secondary | ICD-10-CM | POA: Diagnosis not present

## 2016-12-18 DIAGNOSIS — E784 Other hyperlipidemia: Secondary | ICD-10-CM | POA: Diagnosis not present

## 2016-12-18 DIAGNOSIS — R739 Hyperglycemia, unspecified: Secondary | ICD-10-CM

## 2016-12-18 DIAGNOSIS — E7849 Other hyperlipidemia: Secondary | ICD-10-CM

## 2016-12-18 DIAGNOSIS — Z6827 Body mass index (BMI) 27.0-27.9, adult: Secondary | ICD-10-CM

## 2016-12-18 LAB — POCT GLYCOSYLATED HEMOGLOBIN (HGB A1C): Hemoglobin A1C: 5.9

## 2016-12-18 MED ORDER — LIPO-FLAVONOID PLUS PO TABS: ORAL_TABLET | ORAL | 0 refills | Status: DC

## 2016-12-18 MED ORDER — RANITIDINE HCL 150 MG PO TABS: 150.0000 mg | ORAL_TABLET | Freq: Two times a day (BID) | ORAL | 3 refills | Status: DC

## 2016-12-18 MED ORDER — EZETIMIBE 10 MG PO TABS: 10.0000 mg | ORAL_TABLET | ORAL | 3 refills | Status: DC

## 2016-12-18 NOTE — Patient Instructions (Addendum)
Re check cholesterol last week of may-3 weeks after starting Zetia.  Prescriptions sent in to [facility].

## 2016-12-18 NOTE — Progress Notes (Signed)
Adam Hanna.  MRN: [id number] DOB: [birth year]  Subjective:  HPI  Patient is here for 6 months follow up. Hyperglycemia: patient does not check his sugar at home. Lab Results  Component Value Date   HGBA1C 5.9 (H) 06/20/2016   B/P: patient is not checking his b/p. No cardiac symptoms. Had appointment with Dr Nehemiah Massed about 4 months ago for check up.  BP Readings from Last 3 Encounters:  12/18/16 124/70  06/20/16 (!) 142/68  03/15/16 124/72   GERD: patient takes Omeprazole daily and does forget sometimes. Has not had any issues with heartburn in the years he has been taking this medication. He would like to discuss this medication. Last labs: lipid 06/20/16, all other labs on 03/15/16.  Patient Active Problem List   Diagnosis Date Noted  . Chest pain 02/24/2016  . 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 Dyslipidemia 12/25/2014  . 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 Temporary cerebral vascular dysfunction 12/25/2014  . Acid reflux 10/19/2009  . BP (high blood pressure) 06/23/2008    Past Medical History:  Diagnosis Date  . GERD (gastroesophageal reflux disease)   . Hyperlipidemia   . Hypertension     Social History   Social History  . Marital status: Married    Spouse name: N/A  . Number of children: N/A  . Years of education: N/A   Occupational History  . Not on file.   Social History Main Topics  . Smoking status: Former Smoker    Packs/day: 1.00    Years: 20.00    Types: Cigarettes    Quit date: 08/21/1959  . Smokeless tobacco: Never Used  . Alcohol use 4.2 oz/week    7 Standard drinks or equivalent per week     Comment: about 4 times a week 1 to 2 drinks at a time  . Drug use: No  . Sexual activity: Not on file   Other Topics Concern  . Not on file   Social History Narrative  . No narrative on file    Outpatient Encounter Prescriptions as of 12/18/2016  Medication  Sig  . aspirin EC 81 MG tablet Take 81 mg by mouth daily.  Marland Kitchen NIFEdipine (PROCARDIA-XL/ADALAT-CC/NIFEDICAL-XL) 30 MG 24 hr tablet TAKE 1 TABLET DAILY  . omeprazole (PRILOSEC) 20 MG capsule Take 1 capsule (20 mg total) by mouth daily.  . [DISCONTINUED] naproxen sodium (ANAPROX) 220 MG tablet Take 220 mg by mouth daily.   No facility-administered encounter medications on file as of 12/18/2016.     No Known Allergies  Review of Systems  Constitutional: Positive for malaise/fatigue.  HENT: Positive for tinnitus.   Eyes: Negative.   Respiratory: Negative.   Cardiovascular: Negative.   Gastrointestinal: Negative.   Musculoskeletal: Positive for myalgias (right arm-upper and lower, x 6 months).  Neurological: Negative.   Endo/Heme/Allergies: Negative.   Psychiatric/Behavioral: Negative.     Objective:  BP 124/70   Pulse 72   Temp 98 F (36.7 C)   Resp 14   Wt 211 lb (95.7 kg)   BMI 27.84 kg/m   Physical Exam  Constitutional: He is oriented to person, place, and time and well-developed, well-nourished, and in no distress.  HENT:  Head: Normocephalic and atraumatic.  Right Ear: External ear normal.  Left Ear: External ear normal.  Nose: Nose normal.  Eyes: Conjunctivae are normal. Pupils are equal, round, and reactive to light.  Neck: Normal range of motion. Neck supple. No  thyromegaly present.  Cardiovascular: Normal rate, regular rhythm, normal heart sounds and intact distal pulses.   No murmur heard. Pulmonary/Chest: Effort normal and breath sounds normal. No respiratory distress. He has no wheezes.  Neurological: He is alert and oriented to person, place, and time.  Skin: Skin is warm and dry.  Psychiatric: Mood, memory, affect and judgment normal.   Assessment and Plan :  1. Essential hypertension Stable. continue current medication. 2. Hyperglycemia Stable at 5.9. Continue working on habtis - POCT HgB A1C  3. Gastroesophageal reflux disease, esophagitis presence not  specified Stable. Discussed Omeprazole use and the potential risks. Switch to Ranitidine BID and if symptoms are controlled on this we can switch to once daily then.  4. Other hyperlipidemia Discussed treating this and will try Zetia and advised patient to re check in 3 weeks after starting this medication.  5. BMI 27.0-27.9,adult Continue working on habits. 6. Tinnitus. Discussed this issue and that Lipo flavonoid is safe to try at this time. 7. Fatigue Discussed this in details. Different reasons this could be an issue, could be just age related. Discussed risks vs benefits with treating Testosterone. Epworth score today is 5.  HPI, Exam and A&P transcribed by Theressa Millard, RMA under direction and in the presence of Miguel Aschoff, MD. I have done the exam and reviewed the chart and it is accurate to the best of my knowledge. Development worker, community has been used and  any errors in dictation or transcription are unintentional. Miguel Aschoff M.D. [facility]

## 2017-01-16 DIAGNOSIS — M5416 Radiculopathy, lumbar region: Secondary | ICD-10-CM | POA: Diagnosis not present

## 2017-01-16 DIAGNOSIS — M545 Low back pain: Secondary | ICD-10-CM | POA: Diagnosis not present

## 2017-01-17 DIAGNOSIS — E784 Other hyperlipidemia: Secondary | ICD-10-CM | POA: Diagnosis not present

## 2017-01-18 LAB — LIPID PANEL WITH LDL/HDL RATIO
Cholesterol, Total: 236 mg/dL — ABNORMAL HIGH (ref 100–199)
HDL: 50 mg/dL (ref >39)
LDL Calculated: 156 mg/dL — ABNORMAL HIGH (ref 0–99)
LDL/HDL RATIO: 3.1 ratio (ref 0.0–3.6)
Triglycerides: 148 mg/dL (ref 0–149)
VLDL Cholesterol Cal: 30 mg/dL (ref 5–40)

## 2017-03-09 DIAGNOSIS — M5416 Radiculopathy, lumbar region: Secondary | ICD-10-CM | POA: Diagnosis not present

## 2017-03-09 DIAGNOSIS — M7711 Lateral epicondylitis, right elbow: Secondary | ICD-10-CM | POA: Diagnosis not present

## 2017-03-16 ENCOUNTER — Ambulatory Visit (INDEPENDENT_AMBULATORY_CARE_PROVIDER_SITE_OTHER): Payer: Medicare Other

## 2017-03-16 VITALS — BP 152/82 | HR 68 | Temp 99.0°F | Ht 73.0 in | Wt 209.0 lb

## 2017-03-16 DIAGNOSIS — Z0000 Encounter for general adult medical examination without abnormal findings: Secondary | ICD-10-CM

## 2017-03-16 NOTE — Progress Notes (Signed)
Subjective:   Adam Hanna. is a 83 y.o. male who presents for Medicare Annual/Subsequent preventive examination.  Review of Systems:  N/A  Cardiac Risk Factors include: advanced age (>95men, >79 women);dyslipidemia;hypertension;male gender;sedentary lifestyle     Objective:    Vitals: BP (!) 152/82 (BP Location: Left Arm)   Pulse 68   Temp 99 F (37.2 C) (Oral)   Ht 6\' 1"  (1.854 m)   Wt 209 lb (94.8 kg)   BMI 27.57 kg/m   Body mass index is 27.57 kg/m.  Tobacco History  Smoking Status  . Former Smoker  . Packs/day: 1.00  . Years: 20.00  . Types: Cigarettes  . Quit date: 08/21/1963  Smokeless Tobacco  . Never Used     Counseling given: Not Answered   Past Medical History:  Diagnosis Date  . GERD (gastroesophageal reflux disease)   . Hyperlipidemia   . Hypertension    Past Surgical History:  Procedure Laterality Date  . CATARACT EXTRACTION Bilateral   . CYST REMOVAL NECK    . MELANOMA EXCISION     Family History  Problem Relation Age of Onset  . Congestive Heart Failure Mother   . Dementia Father   . Other Sister        Suicide by gunshot wound   History  Sexual Activity  . Sexual activity: Not on file    Outpatient Encounter Prescriptions as of 03/16/2017  Medication Sig  . aspirin EC 81 MG tablet Take 81 mg by mouth daily.  Marland Kitchen ezetimibe (ZETIA) 10 MG tablet Take 1 tablet (10 mg total) by mouth every morning.  Marland Kitchen NIFEdipine (PROCARDIA-XL/ADALAT-CC/NIFEDICAL-XL) 30 MG 24 hr tablet TAKE 1 TABLET DAILY  . ranitidine (ZANTAC) 150 MG tablet Take 1 tablet (150 mg total) by mouth 2 (two) times daily. (Patient taking differently: Take 150 mg by mouth at bedtime. )  . [DISCONTINUED] Vitamins-Lipotropics (LIPO-FLAVONOID PLUS) TABS 1 daily   No facility-administered encounter medications on file as of 03/16/2017.     Activities of Daily Living In your present state of health, do you have any difficulty performing the following activities: 03/16/2017  06/20/2016  Hearing? N N  Vision? N N  Difficulty concentrating or making decisions? N N  Walking or climbing stairs? N N  Dressing or bathing? N N  Doing errands, shopping? N N  Preparing Food and eating ? N -  Using the Toilet? N -  In the past six months, have you accidently leaked urine? N -  Do you have problems with loss of bowel control? N -  Managing your Medications? N -  Managing your Finances? N -  Housekeeping or managing your Housekeeping? N -  Some recent data might be hidden    Patient Care Team: Jerrol Banana., MD as PCP - General (Family Medicine) Corey Skains, MD as Consulting Physician (Cardiology) Ralene Bathe, MD as Consulting Physician (Dermatology) Thornton Park, MD as Referring Physician (Orthopedic Surgery)   Assessment:     Exercise Activities and Dietary recommendations Current Exercise Habits: Structured exercise class, Type of exercise: strength training/weights (plays golf once a week), Time (Minutes): 30, Frequency (Times/Week): 1, Weekly Exercise (Minutes/Week): 30, Intensity: Mild, Exercise limited by: None identified  Goals    . Increase water intake          Recommend increasing water intake to 6 glasses of water a day.       Fall Risk Fall Risk  03/16/2017 03/15/2016  Falls in the past  year? No No   Depression Screen PHQ 2/9 Scores 03/16/2017 03/15/2016  PHQ - 2 Score 0 0    Cognitive Function     6CIT Screen 03/16/2017  What Year? 0 points  What month? 0 points  What time? 0 points  Count back from 20 0 points  Months in reverse 0 points  Repeat phrase 4 points  Total Score 4    Immunization History  Administered Date(s) Administered  . Pneumococcal Conjugate-13 02/04/2014  . Pneumococcal Polysaccharide-23 06/23/2005  . Tdap 07/31/2013  . Zoster 06/19/2006   Screening Tests Health Maintenance  Topic Date Due  . INFLUENZA VACCINE  03/21/2017  . TETANUS/TDAP  08/01/2023  . PNA vac Low Risk Adult   Completed      Plan:  I have personally reviewed and addressed the Medicare Annual Wellness questionnaire and have noted the following in the patient's chart:  A. Medical and social history B. Use of alcohol, tobacco or illicit drugs  C. Current medications and supplements D. Functional ability and status E.  Nutritional status F.  Physical activity G. Advance directives H. List of other physicians I.  Hospitalizations, surgeries, and ER visits in previous 12 months J.  [place] such as hearing and vision if needed, cognitive and depression L. Referrals and appointments - none  In addition, I have reviewed and discussed with patient certain preventive protocols, quality metrics, and best practice recommendations. A written personalized care plan for preventive services as well as general preventive health recommendations were provided to patient.  See attached scanned questionnaire for additional information.   Signed,  Fabio Neighbors, LPN Nurse Health Advisor   MD Recommendations: None.

## 2017-03-16 NOTE — Patient Instructions (Signed)
Adam Hanna , Thank you for taking time to come for your Medicare Wellness Visit. I appreciate your ongoing commitment to your health goals. Please review the following plan we discussed and let me know if I can assist you in the future.   Screening recommendations/referrals: Colonoscopy: completed 06/11/12 Recommended yearly ophthalmology/optometry visit for glaucoma screening and checkup Recommended yearly dental visit for hygiene and checkup  Vaccinations: Influenza vaccine: due 04/2017 Pneumococcal vaccine: completed series Tdap vaccine: completed 07/31/13, due 07/2023 Shingles vaccine: completed 06/19/06    Advanced directives: On file, copy brought in today.  Conditions/risks identified: Recommend increasing water intake to 6 glasses of water a day.   Next appointment: 06/18/17  Preventive Care 81 Years and Older, Male Preventive care refers to lifestyle choices and visits with your health care provider that can promote health and wellness. What does preventive care include?  A yearly physical exam. This is also called an annual well check.  Dental exams once or twice a year.  Routine eye exams. Ask your health care provider how often you should have your eyes checked.  Personal lifestyle choices, including:  Daily care of your teeth and gums.  Regular physical activity.  Eating a healthy diet.  Avoiding tobacco and drug use.  Limiting alcohol use.  Practicing safe sex.  Taking low doses of aspirin every day.  Taking vitamin and mineral supplements as recommended by your health care provider. What happens during an annual well check? The services and screenings done by your health care provider during your annual well check will depend on your age, overall health, lifestyle risk factors, and family history of disease. Counseling  Your health care provider may ask you questions about your:  Alcohol use.  Tobacco use.  Drug use.  Emotional  well-being.  Home and relationship well-being.  Sexual activity.  Eating habits.  History of falls.  Memory and ability to understand (cognition).  Work and work Statistician. Screening  You may have the following tests or measurements:  Height, weight, and BMI.  Blood pressure.  Lipid and cholesterol levels. These may be checked every 5 years, or more frequently if you are over 63 years old.  Skin check.  Lung cancer screening. You may have this screening every year starting at age 46 if you have a 30-pack-year history of smoking and currently smoke or have quit within the past 15 years.  Fecal occult blood test (FOBT) of the stool. You may have this test every year starting at age 25.  Flexible sigmoidoscopy or colonoscopy. You may have a sigmoidoscopy every 5 years or a colonoscopy every 10 years starting at age 27.  Prostate cancer screening. Recommendations will vary depending on your family history and other risks.  Hepatitis C blood test.  Hepatitis B blood test.  Sexually transmitted disease (STD) testing.  Diabetes screening. This is done by checking your blood sugar (glucose) after you have not eaten for a while (fasting). You may have this done every 1-3 years.  Abdominal aortic aneurysm (AAA) screening. You may need this if you are a current or former smoker.  Osteoporosis. You may be screened starting at age 72 if you are at high risk. Talk with your health care provider about your test results, treatment options, and if necessary, the need for more tests. Vaccines  Your health care provider may recommend certain vaccines, such as:  Influenza vaccine. This is recommended every year.  Tetanus, diphtheria, and acellular pertussis (Tdap, Td) vaccine. You may need a  Td booster every 10 years.  Zoster vaccine. You may need this after age 41.  Pneumococcal 13-valent conjugate (PCV13) vaccine. One dose is recommended after age 78.  Pneumococcal  polysaccharide (PPSV23) vaccine. One dose is recommended after age 81. Talk to your health care provider about which screenings and vaccines you need and how often you need them. This information is not intended to replace advice given to you by your health care provider. Make sure you discuss any questions you have with your health care provider. Document Released: 09/03/2015 Document Revised: 04/26/2016 Document Reviewed: 06/08/2015 Elsevier Interactive Patient Education  2017 [place] Prevention in the Home Falls can cause injuries. They can happen to people of all ages. There are many things you can do to make your home safe and to help prevent falls. What can I do on the outside of my home?  Regularly fix the edges of walkways and driveways and fix any cracks.  Remove anything that might make you trip as you walk through a door, such as a raised step or threshold.  Trim any bushes or trees on the path to your home.  Use bright outdoor lighting.  Clear any walking paths of anything that might make someone trip, such as rocks or tools.  Regularly check to see if handrails are loose or broken. Make sure that both sides of any steps have handrails.  Any raised decks and porches should have guardrails on the edges.  Have any leaves, snow, or ice cleared regularly.  Use sand or salt on walking paths during winter.  Clean up any spills in your garage right away. This includes oil or grease spills. What can I do in the bathroom?  Use night lights.  Install grab bars by the toilet and in the tub and shower. Do not use towel bars as grab bars.  Use non-skid mats or decals in the tub or shower.  If you need to sit down in the shower, use a plastic, non-slip stool.  Keep the floor dry. Clean up any water that spills on the floor as soon as it happens.  Remove soap buildup in the tub or shower regularly.  Attach bath mats securely with double-sided non-slip rug  tape.  Do not have throw rugs and other things on the floor that can make you trip. What can I do in the bedroom?  Use night lights.  Make sure that you have a light by your bed that is easy to reach.  Do not use any sheets or blankets that are too big for your bed. They should not hang down onto the floor.  Have a firm chair that has side arms. You can use this for support while you get dressed.  Do not have throw rugs and other things on the floor that can make you trip. What can I do in the kitchen?  Clean up any spills right away.  Avoid walking on wet floors.  Keep items that you use a lot in easy-to-reach places.  If you need to reach something above you, use a strong step stool that has a grab bar.  Keep electrical cords out of the way.  Do not use floor polish or wax that makes floors slippery. If you must use wax, use non-skid floor wax.  Do not have throw rugs and other things on the floor that can make you trip. What can I do with my stairs?  Do not leave any items on the stairs.  Make sure that there are handrails on both sides of the stairs and use them. Fix handrails that are broken or loose. Make sure that handrails are as long as the stairways.  Check any carpeting to make sure that it is firmly attached to the stairs. Fix any carpet that is loose or worn.  Avoid having throw rugs at the top or bottom of the stairs. If you do have throw rugs, attach them to the floor with carpet tape.  Make sure that you have a light switch at the top of the stairs and the bottom of the stairs. If you do not have them, ask someone to add them for you. What else can I do to help prevent falls?  Wear shoes that:  Do not have high heels.  Have rubber bottoms.  Are comfortable and fit you well.  Are closed at the toe. Do not wear sandals.  If you use a stepladder:  Make sure that it is fully opened. Do not climb a closed stepladder.  Make sure that both sides of the  stepladder are locked into place.  Ask someone to hold it for you, if possible.  Clearly mark and make sure that you can see:  Any grab bars or handrails.  First and last steps.  Where the edge of each step is.  Use tools that help you move around (mobility aids) if they are needed. These include:  Canes.  Walkers.  Scooters.  Crutches.  Turn on the lights when you go into a dark area. Replace any light bulbs as soon as they burn out.  Set up your furniture so you have a clear path. Avoid moving your furniture around.  If any of your floors are uneven, fix them.  If there are any pets around you, be aware of where they are.  Review your medicines with your doctor. Some medicines can make you feel dizzy. This can increase your chance of falling. Ask your doctor what other things that you can do to help prevent falls. This information is not intended to replace advice given to you by your health care provider. Make sure you discuss any questions you have with your health care provider. Document Released: 06/03/2009 Document Revised: 01/13/2016 Document Reviewed: 09/11/2014 Elsevier Interactive Patient Education  2017 [employer].

## 2017-03-19 ENCOUNTER — Encounter: Payer: Self-pay | Admitting: Family Medicine

## 2017-03-19 ENCOUNTER — Ambulatory Visit: Payer: Medicare Other | Admitting: Family Medicine

## 2017-03-19 ENCOUNTER — Ambulatory Visit (INDEPENDENT_AMBULATORY_CARE_PROVIDER_SITE_OTHER): Payer: Medicare Other | Admitting: Family Medicine

## 2017-03-19 VITALS — BP 126/72 | HR 72 | Temp 97.4°F | Resp 16 | Wt 210.0 lb

## 2017-03-19 DIAGNOSIS — R159 Full incontinence of feces: Secondary | ICD-10-CM

## 2017-03-19 DIAGNOSIS — K921 Melena: Secondary | ICD-10-CM

## 2017-03-19 NOTE — Patient Instructions (Signed)
Start taking OTC Metamucil daily   Start taking OTC probiotic such as Align daily   Psyllium granules or powder for solution (Metamucil) What is this medicine? PSYLLIUM (SIL i yum) is a bulk-forming fiber laxative. This medicine is used to treat constipation. Increasing fiber in the diet may also help lower cholesterol and promote heart health for some people. This medicine may be used for other purposes; ask your health care provider or pharmacist if you have questions. COMMON BRAND NAME(S): GenFiber, Hydrocil, Konsyl, Metamucil, Metamucil MultiHealth, Natural Fiber Therapy, Reguloid What should I tell my health care provider before I take this medicine? They need to know if you have any of these conditions: -change in bowel habits for more than 14 days -blocked intestines or bowel -phenylketonuria -stomach pain, nausea, or vomiting -trouble swallowing -an unusual or allergic reaction to psyllium, tartrazine dye, other medicines, dyes, or preservatives -pregnant or trying or get pregnant -breast-feeding How should I use this medicine? Mix this medicine into a full glass of water or other drink. Take by mouth. Follow the directions on the package labeling, or take as directed by your health care professional. Mix this medicine with 8 ounces or more of fluid. If the drink thickens add more fluid before drinking. Take your medicine at regular intervals. Do not take your medicine more often than directed. Talk to your pediatrician regarding the use of this medicine in children. While this drug may be prescribed for children as young as 33 years old for selected conditions, precautions do apply. Overdosage: If you think you have taken too much of this medicine contact a poison control center or emergency room at once. NOTE: This medicine is only for you. Do not share this medicine with others. What if I miss a dose? If you miss a dose, take it as soon as you can. If it is almost time for your  next dose, take only that dose. Do not take double or extra doses. What may interact with this medicine? Interactions are not expected. This list may not describe all possible interactions. Give your health care provider a list of all the medicines, herbs, non-prescription drugs, or dietary supplements you use. Also tell them if you smoke, drink alcohol, or use illegal drugs. Some items may interact with your medicine. What should I watch for while using this medicine? This medicine can take up to 3 days to work. Check with your doctor or health care professional if your symptoms do not start to get better or if they get worse. See your doctor if you have to treat your constipation for more than 1 week. Avoid taking other medicines within 2 hours of taking this medicine. Drink several glasses of water a day while you are taking this medicine. This will help to relieve constipation and prevent dehydration. Avoid breathing in this medicine. This can cause an allergic reaction or make it difficult to breathe. What side effects may I notice from receiving this medicine? Side effects that you should report to your doctor or health care professional as soon as possible: -allergic reactions like skin rash, itching or hives, swelling of the face, lips, or tongue -breathing problems -chest pain -nausea, vomiting -rectal bleeding -trouble swallowing Side effects that usually do not require medical attention (report to your doctor or health care professional if they continue or are bothersome): -bloated or 'gassy' feeling -diarrhea -headache -stomach cramps This list may not describe all possible side effects. Call your doctor for medical advice about side  effects. You may report side effects to FDA at 1-800-FDA-1088. Where should I keep my medicine? Keep out of the reach of children. Store at room temperature between 15 and 30 degrees C (59 and 86 degrees F). Protect from moisture. Throw away any  unused medicine after the expiration date. NOTE: This sheet is a summary. It may not cover all possible information. If you have questions about this medicine, talk to your doctor, pharmacist, or health care provider.  2018 Elsevier/Gold Standard (2015-06-16 73:56:70)

## 2017-03-19 NOTE — Progress Notes (Signed)
       Patient: Adam Hanna. Male    DOB: 1925/09/16   82 y.o.   MRN: [id number] Visit Date: 03/19/2017  Today's Provider: Lelon Huh, MD   Chief Complaint  Patient presents with  . Encopresis   Subjective:    HPI   Encopresis Adam Hanna presents in office today c/o bowel incontinence. This has been occurring for 10 days, and has been unchanged since that time. He does state he a normal bowel movement today after scheduling this appointment, but the incontinence occurred again shortly after eating. This problem is exacerbated by eating any food. This happens about 4-6 times a day, and looks like "chocolate pudding". It is about the amount of a tablespoon. Pt denies melena, hematochezia, abdominal pain, fever, nausea, vomiting. He does state that he has been gassier than usual. He has tried [employer], without relief. He is not sure if stool turned dark before or after he started using Pepto-Bismol  No Known Allergies   Current Outpatient Prescriptions:  .  aspirin EC 81 MG tablet, Take 81 mg by mouth daily., Disp: , Rfl:  .  ezetimibe (ZETIA) 10 MG tablet, Take 1 tablet (10 mg total) by mouth every morning., Disp: 90 tablet, Rfl: 3 .  NIFEdipine (PROCARDIA-XL/ADALAT-CC/NIFEDICAL-XL) 30 MG 24 hr tablet, TAKE 1 TABLET DAILY, Disp: 90 tablet, Rfl: 2 .  ranitidine (ZANTAC) 150 MG tablet, Take 1 tablet (150 mg total) by mouth 2 (two) times daily. (Patient taking differently: Take 150 mg by mouth at bedtime. ), Disp: 180 tablet, Rfl: 3  Review of Systems  Constitutional: Negative for activity change, appetite change, chills, diaphoresis, fatigue, fever and unexpected weight change.  Cardiovascular: Negative for chest pain, palpitations and leg swelling.  Gastrointestinal: Negative for abdominal pain, blood in stool, constipation, diarrhea, nausea and vomiting.       Bowel incontinence is present    Social History  Substance Use Topics  . Smoking status: Former Smoker   Packs/day: 1.00    Years: 20.00    Types: Cigarettes    Quit date: 08/21/1963  . Smokeless tobacco: Never Used  . Alcohol use 6.6 - 9.0 oz/week    4 - 8 Glasses of wine, 7 Standard drinks or equivalent per week   Objective:   BP 126/72 (BP Location: Right Arm, Patient Position: Sitting, Cuff Size: Large)   Pulse 72   Temp (!) 97.4 F (36.3 C) (Oral)   Resp 16   Wt 210 lb (95.3 kg)   BMI 27.71 kg/m  Vitals:   03/19/17 1530  BP: 126/72  Pulse: 72  Resp: 16  Temp: (!) 97.4 F (36.3 C)  TempSrc: Oral  Weight: 210 lb (95.3 kg)     Physical Exam   General appearance: alert, well developed, well nourished, cooperative and in no distress Rectal: Normal tone. No masses. Dark brown stool  IFOBT: Negative     Assessment & Plan:     1. Incontinence of feces, unspecified fecal incontinence type He reports more gas than usual. He already consumes a large amount of fiber in diet. Recommend he add probiotics for now.  2. Melena This may be due to pepto-bismol but need to check CBC to rule out GI blood loss.  - CBC       Lelon Huh, MD  [facility]

## 2017-03-20 LAB — CBC
Hematocrit: 42.4 % (ref 37.5–51.0)
Hemoglobin: 14.1 g/dL (ref 13.0–17.7)
MCH: 29.4 pg (ref 26.6–33.0)
MCHC: 33.3 g/dL (ref 31.5–35.7)
MCV: 89 fL (ref 79–97)
PLATELETS: 276 10*3/uL (ref 150–379)
RBC: 4.79 x10E6/uL (ref 4.14–5.80)
RDW: 14.2 % (ref 12.3–15.4)
WBC: 6.5 10*3/uL (ref 3.4–10.8)

## 2017-03-27 DIAGNOSIS — E782 Mixed hyperlipidemia: Secondary | ICD-10-CM | POA: Diagnosis not present

## 2017-03-27 DIAGNOSIS — I10 Essential (primary) hypertension: Secondary | ICD-10-CM | POA: Diagnosis not present

## 2017-04-13 DIAGNOSIS — M5116 Intervertebral disc disorders with radiculopathy, lumbar region: Secondary | ICD-10-CM | POA: Diagnosis not present

## 2017-04-16 DIAGNOSIS — M48061 Spinal stenosis, lumbar region without neurogenic claudication: Secondary | ICD-10-CM | POA: Diagnosis not present

## 2017-04-16 DIAGNOSIS — M5416 Radiculopathy, lumbar region: Secondary | ICD-10-CM | POA: Diagnosis not present

## 2017-04-19 DIAGNOSIS — M4726 Other spondylosis with radiculopathy, lumbar region: Secondary | ICD-10-CM | POA: Diagnosis not present

## 2017-04-19 DIAGNOSIS — M5126 Other intervertebral disc displacement, lumbar region: Secondary | ICD-10-CM | POA: Diagnosis not present

## 2017-05-01 DIAGNOSIS — M48062 Spinal stenosis, lumbar region with neurogenic claudication: Secondary | ICD-10-CM | POA: Diagnosis not present

## 2017-05-01 DIAGNOSIS — M5416 Radiculopathy, lumbar region: Secondary | ICD-10-CM | POA: Diagnosis not present

## 2017-05-07 DIAGNOSIS — L570 Actinic keratosis: Secondary | ICD-10-CM | POA: Diagnosis not present

## 2017-05-07 DIAGNOSIS — L821 Other seborrheic keratosis: Secondary | ICD-10-CM | POA: Diagnosis not present

## 2017-05-07 DIAGNOSIS — Z85820 Personal history of malignant melanoma of skin: Secondary | ICD-10-CM | POA: Diagnosis not present

## 2017-05-07 DIAGNOSIS — D229 Melanocytic nevi, unspecified: Secondary | ICD-10-CM | POA: Diagnosis not present

## 2017-05-07 DIAGNOSIS — L578 Other skin changes due to chronic exposure to nonionizing radiation: Secondary | ICD-10-CM | POA: Diagnosis not present

## 2017-05-07 DIAGNOSIS — Z85828 Personal history of other malignant neoplasm of skin: Secondary | ICD-10-CM | POA: Diagnosis not present

## 2017-05-07 DIAGNOSIS — D485 Neoplasm of uncertain behavior of skin: Secondary | ICD-10-CM | POA: Diagnosis not present

## 2017-05-07 DIAGNOSIS — R21 Rash and other nonspecific skin eruption: Secondary | ICD-10-CM | POA: Diagnosis not present

## 2017-05-07 DIAGNOSIS — L812 Freckles: Secondary | ICD-10-CM | POA: Diagnosis not present

## 2017-05-07 DIAGNOSIS — L820 Inflamed seborrheic keratosis: Secondary | ICD-10-CM | POA: Diagnosis not present

## 2017-05-17 DIAGNOSIS — M48062 Spinal stenosis, lumbar region with neurogenic claudication: Secondary | ICD-10-CM | POA: Diagnosis not present

## 2017-05-17 DIAGNOSIS — M5126 Other intervertebral disc displacement, lumbar region: Secondary | ICD-10-CM | POA: Diagnosis not present

## 2017-05-19 ENCOUNTER — Ambulatory Visit (INDEPENDENT_AMBULATORY_CARE_PROVIDER_SITE_OTHER): Payer: Medicare Other | Admitting: Physician Assistant

## 2017-05-19 ENCOUNTER — Encounter: Payer: Self-pay | Admitting: Physician Assistant

## 2017-05-19 VITALS — BP 150/90 | HR 83 | Temp 98.0°F | Resp 16 | Wt 203.0 lb

## 2017-05-19 DIAGNOSIS — I10 Essential (primary) hypertension: Secondary | ICD-10-CM | POA: Diagnosis not present

## 2017-05-19 MED ORDER — NIFEDIPINE ER OSMOTIC RELEASE 30 MG PO TB24: 60.0000 mg | ORAL_TABLET | Freq: Every day | ORAL | 0 refills | Status: DC

## 2017-05-19 NOTE — Patient Instructions (Signed)
Nifedipine extended-release tablets What is this medicine? NIFEDIPINE (nye FED i peen) is a calcium-channel blocker. It affects the amount of calcium found in your heart and muscle cells. This relaxes your blood vessels, which can reduce the amount of work the heart has to do. This medicine is used to treat high blood pressure and chest pain caused by angina. This medicine may be used for other purposes; ask your health care provider or pharmacist if you have questions. COMMON BRAND NAME(S): Adalat CC, Afeditab CR, Nifediac CC, Nifedical XL, Procardia XL What should I tell my health care provider before I take this medicine? They need to know if you have any of these conditions: -heart problems, low blood pressure, slow or irregular heartbeat -kidney disease -liver disease -previous heart attack -stomach or intestine problems -an unusual or allergic reaction to nifedipine, other medicines, foods, dyes, or preservatives -pregnant or trying to get pregnant -breast-feeding How should I use this medicine? Take this medicine by mouth with a glass of water. Follow the directions on the prescription label. Do not cut, crush or chew. Take your doses at regular intervals. Do not take your medicine more often then directed. Do not suddenly stop taking this medicine. Your doctor will tell you how much medicine to take. If your doctor wants you to stop the medicine, the dose will be slowly lowered over time to avoid any side effects. Talk to your pediatrician regarding the use of this medicine in children. Special care may be needed. Overdosage: If you think you have taken too much of this medicine contact a poison control center or emergency room at once. NOTE: This medicine is only for you. Do not share this medicine with others. What if I miss a dose? If you miss a dose, take it as soon as you can. If it is almost time for your next dose, take only that dose. Do not take double or extra doses. What may  interact with this medicine? Do not take this medicine with any of the following medications: -certain medicines for seizures like carbamazepine, phenobarbital, phenytoin -lumacaftor; ivacaftor -rifabutin -rifampin -rifapentine -St. John's Wort This medicine may also interact with the following medications: -antiviral medicines for HIV or AIDS -certain medicines for blood pressure -certain medicines for diabetes -certain medicines for erectile dysfunction -certain medicines for fungal infections like ketoconazole, fluconazole, and itraconazole -certain medicines for irregular heart beat like flecainide and quinidine -certain medicines that treat or prevent blood clots like warfarin -clarithromycin -digoxin -dolasetron -erythromycin -fluoxetine -grapefruit juice -local or general anesthetics -nefazodone -orlistat -quinupristin; dalfopristin -sirolimus -stomach acid blockers like cimetidine, ranitidine, omeprazole, or pantoprazole -tacrolimus -valproic acid This list may not describe all possible interactions. Give your health care provider a list of all the medicines, herbs, non-prescription drugs, or dietary supplements you use. Also tell them if you smoke, drink alcohol, or use illegal drugs. Some items may interact with your medicine. What should I watch for while using this medicine? Visit your doctor or health care professional for regular check ups. Check your blood pressure and pulse rate regularly. Ask your doctor or health care professional what your blood pressure and pulse rate should be and when you should contact him or her. You may get drowsy or dizzy. Do not drive, use machinery, or do anything that needs mental alertness until you know how this medicine affects you. Do not stand or sit up quickly, especially if you are an older patient. This reduces the risk of dizzy or fainting spells. Alcohol  may interfere with the effect of this medicine. Avoid alcoholic drinks. If  you are taking Procardia XL, you may notice the empty shell of the tablet in your stool. What side effects may I notice from receiving this medicine? Side effects that you should report to your doctor or health care professional as soon as possible: -blood in the urine -difficulty breathing -fast heartbeat, palpitations, irregular heartbeat, chest pain -redness, blistering, peeling or loosening of the skin, including inside the mouth -reduced amount of urine passed -skin rash -swelling of the legs and ankles Side effects that usually do not require medical attention (report to your doctor or health care professional if they continue or are bothersome): -constipation -facial flushing -headache -weakness or tiredness This list may not describe all possible side effects. Call your doctor for medical advice about side effects. You may report side effects to FDA at 1-800-FDA-1088. Where should I keep my medicine? Keep out of the reach of children. Store at room temperature below 30 degrees C (86 degrees F). Protect from moisture and humidity. Keep container tightly closed. Throw away any unused medicine after the expiration date. NOTE: This sheet is a summary. It may not cover all possible information. If you have questions about this medicine, talk to your doctor, pharmacist, or health care provider.  2018 Elsevier/Gold Standard (2014-10-12 10:25:09)

## 2017-05-19 NOTE — Progress Notes (Signed)
Patient: Adam Hanna. Male    DOB: Aug 26, 1925   83 y.o.   MRN: [id number] Visit Date: 05/19/2017  Today's Provider: Mar Daring, PA-C   Chief Complaint  Patient presents with  . Hypertension   Subjective:    Patient checked his blood pressure about 30 minutes ago at home and is was 167/102. Patient states that he has been feeling "jumpy" for a couple of months and he feels more so today. Patient states he has had a mild headache for the last 2 weeks. No chest pain, sob or any other symptoms.    No Known Allergies   Current Outpatient Prescriptions:  .  aspirin EC 81 MG tablet, Take 81 mg by mouth daily., Disp: , Rfl:  .  NIFEdipine (PROCARDIA-XL/ADALAT-CC/NIFEDICAL-XL) 30 MG 24 hr tablet, TAKE 1 TABLET DAILY, Disp: 90 tablet, Rfl: 2 .  ranitidine (ZANTAC) 150 MG tablet, Take 1 tablet (150 mg total) by mouth 2 (two) times daily. (Patient taking differently: Take 150 mg by mouth at bedtime. ), Disp: 180 tablet, Rfl: 3  Review of Systems  Constitutional: Negative for appetite change, chills and fever.  Respiratory: Negative for chest tightness, shortness of breath and wheezing.   Cardiovascular: Negative for chest pain and palpitations.  Gastrointestinal: Negative for abdominal pain, nausea and vomiting.  Neurological: Positive for headaches. Negative for dizziness and light-headedness.    Social History  Substance Use Topics  . Smoking status: Former Smoker    Packs/day: 1.00    Years: 20.00    Types: Cigarettes    Quit date: 08/21/1963  . Smokeless tobacco: Never Used  . Alcohol use 6.6 - 9.0 oz/week    4 - 8 Glasses of wine, 7 Standard drinks or equivalent per week   Objective:   BP (!) 150/90 (BP Location: Right Arm, Patient Position: Sitting, Cuff Size: Large)   Pulse 83   Temp 98 F (36.7 C) (Oral)   Resp 16   Wt 203 lb (92.1 kg)   SpO2 96%   BMI 26.78 kg/m  Vitals:   05/19/17 1100  BP: (!) 150/90  Pulse: 83  Resp: 16  Temp: 98 F  (36.7 C)  TempSrc: Oral  SpO2: 96%  Weight: 203 lb (92.1 kg)     Physical Exam  Constitutional: He appears well-developed and well-nourished. No distress.  HENT:  Head: Normocephalic and atraumatic.  Neck: Normal range of motion. Neck supple. No JVD present. No tracheal deviation present. No thyromegaly present.  Cardiovascular: Normal rate, regular rhythm and normal heart sounds.  Exam reveals no gallop and no friction rub.   No murmur heard. Pulmonary/Chest: Effort normal and breath sounds normal. No respiratory distress. He has no wheezes. He has no rales.  Musculoskeletal: He exhibits no edema.  Lymphadenopathy:    He has no cervical adenopathy.  Skin: He is not diaphoretic.  Vitals reviewed.      Assessment & Plan:     1. Essential hypertension Will increase nifedipine to 30mg  BID as below. He is to return in 1 week for a BP check. If still elevated will need appt with Dr. Rosanna Randy. If improved will monitor and have him return for his routine follow ups. He is to continue to monitor his BP at home as well and call if readings remain high or if they drop too low.   - NIFEdipine (PROCARDIA-XL/ADALAT-CC/NIFEDICAL-XL) 30 MG 24 hr tablet; Take 2 tablets (60 mg total) by mouth daily.  Dispense: 180 tablet;  Refill: 0       Mar Daring, PA-C  [facility]

## 2017-05-24 DIAGNOSIS — M545 Low back pain: Secondary | ICD-10-CM | POA: Diagnosis not present

## 2017-05-24 DIAGNOSIS — M2560 Stiffness of unspecified joint, not elsewhere classified: Secondary | ICD-10-CM | POA: Diagnosis not present

## 2017-05-31 DIAGNOSIS — M545 Low back pain: Secondary | ICD-10-CM | POA: Diagnosis not present

## 2017-05-31 DIAGNOSIS — M2560 Stiffness of unspecified joint, not elsewhere classified: Secondary | ICD-10-CM | POA: Diagnosis not present

## 2017-06-07 DIAGNOSIS — M2560 Stiffness of unspecified joint, not elsewhere classified: Secondary | ICD-10-CM | POA: Diagnosis not present

## 2017-06-07 DIAGNOSIS — M545 Low back pain: Secondary | ICD-10-CM | POA: Diagnosis not present

## 2017-06-13 DIAGNOSIS — Z23 Encounter for immunization: Secondary | ICD-10-CM | POA: Diagnosis not present

## 2017-06-14 DIAGNOSIS — M2560 Stiffness of unspecified joint, not elsewhere classified: Secondary | ICD-10-CM | POA: Diagnosis not present

## 2017-06-14 DIAGNOSIS — M545 Low back pain: Secondary | ICD-10-CM | POA: Diagnosis not present

## 2017-06-18 ENCOUNTER — Encounter: Payer: Self-pay | Admitting: Family Medicine

## 2017-06-18 ENCOUNTER — Ambulatory Visit (INDEPENDENT_AMBULATORY_CARE_PROVIDER_SITE_OTHER): Payer: Medicare Other | Admitting: Family Medicine

## 2017-06-18 VITALS — BP 136/68 | HR 72 | Wt 204.0 lb

## 2017-06-18 DIAGNOSIS — I10 Essential (primary) hypertension: Secondary | ICD-10-CM

## 2017-06-18 DIAGNOSIS — N401 Enlarged prostate with lower urinary tract symptoms: Secondary | ICD-10-CM

## 2017-06-18 DIAGNOSIS — R350 Frequency of micturition: Secondary | ICD-10-CM

## 2017-06-18 MED ORDER — TAMSULOSIN HCL 0.4 MG PO CAPS: 0.4000 mg | ORAL_CAPSULE | Freq: Every day | ORAL | 11 refills | Status: DC

## 2017-06-18 NOTE — Progress Notes (Signed)
Patient: Adam Hanna. Male    DOB: 1926/02/11   83 y.o.   MRN: [id number] Visit Date: 06/18/2017  Today's Provider: Wilhemena Durie, MD   Chief Complaint  Patient presents with  . Hypertension  . Hyperlipidemia   Subjective:    HPI  Hypertension, follow-up:  BP Readings from Last 3 Encounters:  06/18/17 136/68  05/19/17 (!) 150/90  03/19/17 126/72    He was last seen for hypertension 6 months ago.  BP at that visit was 150/90. Management since that visit includes increased Nifedipine to 30 mg daily. He reports good compliance with treatment. He is not having side effects.  He is exercising. He is adherent to low salt diet.   Outside blood pressures are not being checked, unless he is not feeling well. Patient denies chest pain, chest pressure/discomfort, claudication, dyspnea, exertional chest pressure/discomfort, fatigue, irregular heart beat, lower extremity edema, near-syncope, orthopnea, palpitations, paroxysmal nocturnal dyspnea, syncope and tachypnea.   Cardiovascular risk factors include advanced age (older than 5 for men, 53 for women), dyslipidemia, hypertension and male gender.   Wt Readings from Last 3 Encounters:  06/18/17 204 lb (92.5 kg)  05/19/17 203 lb (92.1 kg)  03/19/17 210 lb (95.3 kg)   -----------------------------------------------------------------------     No Known Allergies   Current Outpatient Prescriptions:  .  aspirin EC 81 MG tablet, Take 81 mg by mouth daily., Disp: , Rfl:  .  NIFEdipine (PROCARDIA-XL/ADALAT-CC/NIFEDICAL-XL) 30 MG 24 hr tablet, Take 2 tablets (60 mg total) by mouth daily., Disp: 180 tablet, Rfl: 0 .  ranitidine (ZANTAC) 150 MG tablet, Take 1 tablet (150 mg total) by mouth 2 (two) times daily. (Patient taking differently: Take 150 mg by mouth at bedtime. ), Disp: 180 tablet, Rfl: 3  Review of Systems  Constitutional: Negative.   HENT: Negative.   Eyes: Negative.   Respiratory: Negative.     Cardiovascular: Negative.   Gastrointestinal: Positive for constipation.  Endocrine: Negative.   Genitourinary: Negative.   Musculoskeletal: Positive for back pain.  Skin: Negative.   Allergic/Immunologic: Negative.   Neurological: Positive for headaches.  Hematological: Negative.   Psychiatric/Behavioral: Negative.     Social History  Substance Use Topics  . Smoking status: Former Smoker    Packs/day: 1.00    Years: 20.00    Types: Cigarettes    Quit date: 08/21/1963  . Smokeless tobacco: Never Used  . Alcohol use 6.6 - 9.0 oz/week    4 - 8 Glasses of wine, 7 Standard drinks or equivalent per week   Objective:   BP 136/68 (BP Location: Left Arm, Patient Position: Sitting, Cuff Size: Normal)   Pulse 72   Wt 204 lb (92.5 kg)   BMI 26.91 kg/m  Vitals:   06/18/17 0839  BP: 136/68  Pulse: 72  Weight: 204 lb (92.5 kg)     Physical Exam  Constitutional: He is oriented to person, place, and time. He appears well-developed and well-nourished.  HENT:  Head: Normocephalic and atraumatic.  Right Ear: External ear normal.  Left Ear: External ear normal.  Nose: Nose normal.  Eyes: Conjunctivae are normal. No scleral icterus.  Neck: No thyromegaly present.  Cardiovascular: Normal rate, regular rhythm and normal heart sounds.   Pulmonary/Chest: Effort normal and breath sounds normal.  Abdominal: Soft.  Neurological: He is alert and oriented to person, place, and time.  Skin: Skin is warm and dry.  Psychiatric: He has a normal mood and affect. His behavior is  normal. Judgment and thought content normal.        Assessment & Plan:     1. Essential hypertension   2. Benign prostatic hyperplasia with urinary frequency Nocturia times 3-4. - tamsulosin (FLOMAX) 0.4 MG CAPS capsule; Take 1 capsule (0.4 mg total) by mouth daily.  Dispense: 30 capsule; Refill: 11 3.LS HNP Pt plans to have laser spinal surgery.     I have done the exam and reviewed the above chart and it  is accurate to the best of my knowledge. Development worker, community has been used in this note in any air is in the dictation or transcription are unintentional.  Wilhemena Durie, MD  [place]

## 2017-07-02 ENCOUNTER — Other Ambulatory Visit: Payer: Self-pay | Admitting: Family Medicine

## 2017-07-02 DIAGNOSIS — M545 Low back pain: Secondary | ICD-10-CM | POA: Diagnosis not present

## 2017-07-02 DIAGNOSIS — K219 Gastro-esophageal reflux disease without esophagitis: Secondary | ICD-10-CM

## 2017-07-02 MED ORDER — RANITIDINE HCL 150 MG PO TABS: 150.0000 mg | ORAL_TABLET | Freq: Every day | ORAL | 3 refills | Status: DC

## 2017-07-02 NOTE — Telephone Encounter (Signed)
Pt contacted office for refill request on the following medications:  ranitidine (ZANTAC) 150 MG tablet.    Pt is requesting this changed from tablet to capsule.  Pt also states he is only take 1 pill a day.  Pt is requesting a 90 day supply.  [facility].  EW#[phone]/[id number]

## 2017-07-04 DIAGNOSIS — M48062 Spinal stenosis, lumbar region with neurogenic claudication: Secondary | ICD-10-CM | POA: Diagnosis not present

## 2017-07-04 DIAGNOSIS — M5126 Other intervertebral disc displacement, lumbar region: Secondary | ICD-10-CM | POA: Diagnosis not present

## 2017-07-04 DIAGNOSIS — M4726 Other spondylosis with radiculopathy, lumbar region: Secondary | ICD-10-CM | POA: Diagnosis not present

## 2017-07-04 DIAGNOSIS — I10 Essential (primary) hypertension: Secondary | ICD-10-CM | POA: Diagnosis not present

## 2017-07-04 DIAGNOSIS — Z7982 Long term (current) use of aspirin: Secondary | ICD-10-CM | POA: Diagnosis not present

## 2017-07-04 DIAGNOSIS — M5116 Intervertebral disc disorders with radiculopathy, lumbar region: Secondary | ICD-10-CM | POA: Diagnosis not present

## 2017-07-04 DIAGNOSIS — Z87891 Personal history of nicotine dependence: Secondary | ICD-10-CM | POA: Diagnosis not present

## 2017-08-22 DIAGNOSIS — M6530 Trigger finger, unspecified finger: Secondary | ICD-10-CM | POA: Diagnosis not present

## 2017-09-13 ENCOUNTER — Other Ambulatory Visit: Payer: Self-pay | Admitting: Family Medicine

## 2017-09-13 DIAGNOSIS — I10 Essential (primary) hypertension: Secondary | ICD-10-CM

## 2017-09-13 MED ORDER — NIFEDIPINE ER OSMOTIC RELEASE 30 MG PO TB24: 60.0000 mg | ORAL_TABLET | Freq: Every day | ORAL | 3 refills | Status: DC

## 2017-09-13 NOTE — Telephone Encounter (Signed)
Pt contacted office for refill request on the following medications:  NIFEdipine (PROCARDIA-XL/ADALAT-CC/NIFEDICAL-XL) 30 MG 24 hr tablet  90 day supply  [place]  Please advise. Thanks TNP

## 2017-11-05 DIAGNOSIS — L821 Other seborrheic keratosis: Secondary | ICD-10-CM | POA: Diagnosis not present

## 2017-11-05 DIAGNOSIS — L578 Other skin changes due to chronic exposure to nonionizing radiation: Secondary | ICD-10-CM | POA: Diagnosis not present

## 2017-11-05 DIAGNOSIS — D1801 Hemangioma of skin and subcutaneous tissue: Secondary | ICD-10-CM | POA: Diagnosis not present

## 2017-11-05 DIAGNOSIS — Z85828 Personal history of other malignant neoplasm of skin: Secondary | ICD-10-CM | POA: Diagnosis not present

## 2017-11-05 DIAGNOSIS — D225 Melanocytic nevi of trunk: Secondary | ICD-10-CM | POA: Diagnosis not present

## 2017-11-05 DIAGNOSIS — L820 Inflamed seborrheic keratosis: Secondary | ICD-10-CM | POA: Diagnosis not present

## 2017-11-05 DIAGNOSIS — L570 Actinic keratosis: Secondary | ICD-10-CM | POA: Diagnosis not present

## 2017-11-05 DIAGNOSIS — D485 Neoplasm of uncertain behavior of skin: Secondary | ICD-10-CM | POA: Diagnosis not present

## 2017-11-05 DIAGNOSIS — Z1283 Encounter for screening for malignant neoplasm of skin: Secondary | ICD-10-CM | POA: Diagnosis not present

## 2017-11-05 DIAGNOSIS — L812 Freckles: Secondary | ICD-10-CM | POA: Diagnosis not present

## 2017-11-05 DIAGNOSIS — Z85820 Personal history of malignant melanoma of skin: Secondary | ICD-10-CM | POA: Diagnosis not present

## 2017-11-27 ENCOUNTER — Telehealth: Payer: Self-pay | Admitting: Family Medicine

## 2017-11-27 NOTE — Telephone Encounter (Signed)
Faxed Most recent OV to [facility] on 11.14.18

## 2017-12-17 ENCOUNTER — Ambulatory Visit (INDEPENDENT_AMBULATORY_CARE_PROVIDER_SITE_OTHER): Payer: Medicare Other | Admitting: Family Medicine

## 2017-12-17 ENCOUNTER — Encounter: Payer: Self-pay | Admitting: Family Medicine

## 2017-12-17 VITALS — BP 132/74 | HR 65 | Temp 97.5°F | Resp 15 | Wt 206.2 lb

## 2017-12-17 DIAGNOSIS — I509 Heart failure, unspecified: Secondary | ICD-10-CM

## 2017-12-17 DIAGNOSIS — E785 Hyperlipidemia, unspecified: Secondary | ICD-10-CM | POA: Diagnosis not present

## 2017-12-17 DIAGNOSIS — I10 Essential (primary) hypertension: Secondary | ICD-10-CM | POA: Diagnosis not present

## 2017-12-17 DIAGNOSIS — N3281 Overactive bladder: Secondary | ICD-10-CM

## 2017-12-17 MED ORDER — OXYBUTYNIN CHLORIDE ER 10 MG PO TB24: 10.0000 mg | ORAL_TABLET | Freq: Every day | ORAL | 3 refills | Status: DC

## 2017-12-17 NOTE — Progress Notes (Signed)
Patient: Adam Hanna. Male    DOB: January 25, 1926   84 y.o.   MRN: [id number] Visit Date: 12/17/2017  Today's Provider: Wilhemena Durie, MD   Chief Complaint  Patient presents with  . Hypertension  . Benign Prostatic Hypertrophy   Subjective:    Benign Prostatic Hypertrophy  This is a recurrent problem. The current episode started more than 1 year ago. Irritative symptoms include nocturia. Obstructive symptoms include dribbling. Obstructive symptoms do not include incomplete emptying, an intermittent stream, a slower stream, straining or a weak stream. Pertinent negatives include no chills, dysuria, genital pain, hematuria, hesitancy, nausea or vomiting. Nothing aggravates the symptoms. Past treatments include tamsulosin (patient reports that he d/c Tamsulosin 6 months ago).  pt has urinary urgency.      Hypertension, follow-up:  BP Readings from Last 3 Encounters:  06/18/17 136/68  05/19/17 (!) 150/90  03/19/17 126/72    He was last seen for hypertension 6 months ago.  BP at that visit was 136/68. Management changes since that visit include plans for laser surgery. He reports excellent compliance with treatment. He is not having side effects.  He is not exercising. He is not adherent to low salt diet.   Outside blood pressures are being checked outside office high diastolic reading 92. He is experiencing none.  Patient denies chest pain, chest pressure/discomfort, claudication, dyspnea, exertional chest pressure/discomfort, fatigue, irregular heart beat, lower extremity edema, near-syncope, orthopnea, palpitations, paroxysmal nocturnal dyspnea, syncope and tachypnea.   Cardiovascular risk factors include advanced age (older than 14 for men, 81 for women), hypertension and male gender.  Use of agents associated with hypertension: NSAIDS.     Weight trend: stable Wt Readings from Last 3 Encounters:  06/18/17 204 lb (92.5 kg)  05/19/17 203 lb (92.1 kg)    03/19/17 210 lb (95.3 kg)    Current diet: in general, a "healthy" diet    ------------------------------------------------------------------------  No Known Allergies   Current Outpatient Medications:  .  aspirin EC 81 MG tablet, Take 81 mg by mouth daily., Disp: , Rfl:  .  NIFEdipine (PROCARDIA-XL/ADALAT-CC/NIFEDICAL-XL) 30 MG 24 hr tablet, Take 2 tablets (60 mg total) by mouth daily., Disp: 180 tablet, Rfl: 3 .  ranitidine (ZANTAC) 150 MG tablet, Take 1 tablet (150 mg total) at bedtime by mouth., Disp: 90 tablet, Rfl: 3 .  tamsulosin (FLOMAX) 0.4 MG CAPS capsule, Take 1 capsule (0.4 mg total) by mouth daily., Disp: 30 capsule, Rfl: 11  Review of Systems  Constitutional: Negative.  Negative for chills.  HENT: Negative.   Eyes: Negative.   Respiratory: Negative.   Cardiovascular: Negative.   Gastrointestinal: Negative for nausea and vomiting.  Endocrine: Positive for polyuria.  Genitourinary: Positive for nocturia. Negative for dysuria, hematuria, hesitancy and incomplete emptying.  Musculoskeletal: Positive for joint swelling.  Allergic/Immunologic: Negative.   Neurological: Negative.   Hematological: Negative.   Psychiatric/Behavioral: Negative.     Social History   Tobacco Use  . Smoking status: Former Smoker    Packs/day: 1.00    Years: 20.00    Pack years: 20.00    Types: Cigarettes    Last attempt to quit: 08/21/1963    Years since quitting: 54.3  . Smokeless tobacco: Never Used  Substance Use Topics  . Alcohol use: Yes    Alcohol/week: 6.6 - 9.0 oz    Types: 4 - 8 Glasses of wine, 7 Standard drinks or equivalent per week   Objective:   There were no  vitals taken for this visit. There were no vitals filed for this visit.   Physical Exam  Constitutional: He is oriented to person, place, and time. He appears well-developed and well-nourished.  HENT:  Head: Normocephalic and atraumatic.  Eyes: No scleral icterus.  Neck: No thyromegaly present.   Cardiovascular: Normal rate, regular rhythm and normal heart sounds.  Pulmonary/Chest: Effort normal and breath sounds normal.  Abdominal: Soft.  Neurological: He is alert and oriented to person, place, and time.  Skin: Skin is warm and dry.  Psychiatric: He has a normal mood and affect. His behavior is normal. Judgment and thought content normal.        Assessment & Plan:     1. Congestive heart failure, unspecified HF chronicity, unspecified heart failure type () R/o CHF--clinically I do not think this is an issue. - EKG 12-Lead - Comprehensive Metabolic Panel (CMET) - Lipid Profile - TSH - Pro b natriuretic peptide (BNP)  2. OAB (overactive bladder) Possible BPH. - oxybutynin (DITROPAN XL) 10 MG 24 hr tablet; Take 1 tablet (10 mg total) by mouth at bedtime.  Dispense: 90 tablet; Refill: 3  3. Essential hypertension  - CBC w/Diff/Platelet - TSH  4. Dyslipidemia  - Comprehensive Metabolic Panel (CMET) - Lipid Profile       Wilhemena Durie, MD  [facility]

## 2017-12-18 ENCOUNTER — Telehealth: Payer: Self-pay

## 2017-12-18 LAB — COMPREHENSIVE METABOLIC PANEL
A/G RATIO: 1.9 (ref 1.2–2.2)
ALT: 21 IU/L (ref 0–44)
AST: 25 IU/L (ref 0–40)
Albumin: 4.5 g/dL (ref 3.5–4.7)
Alkaline Phosphatase: 134 IU/L — ABNORMAL HIGH (ref 39–117)
BUN/Creatinine Ratio: 20 (ref 10–24)
BUN: 20 mg/dL (ref 8–27)
Bilirubin Total: 0.5 mg/dL (ref 0.0–1.2)
CALCIUM: 9.3 mg/dL (ref 8.6–10.2)
CO2: 26 mmol/L (ref 20–29)
CREATININE: 1.02 mg/dL (ref 0.76–1.27)
Chloride: 100 mmol/L (ref 96–106)
GFR, EST AFRICAN AMERICAN: 75 mL/min/{1.73_m2} (ref >59)
GFR, EST NON AFRICAN AMERICAN: 65 mL/min/{1.73_m2} (ref >59)
GLUCOSE: 92 mg/dL (ref 65–99)
Globulin, Total: 2.4 g/dL (ref 1.5–4.5)
Potassium: 5.1 mmol/L (ref 3.5–5.2)
Sodium: 140 mmol/L (ref 134–144)
TOTAL PROTEIN: 6.9 g/dL (ref 6.0–8.5)

## 2017-12-18 LAB — CBC WITH DIFFERENTIAL/PLATELET
BASOS: 1 %
Basophils Absolute: 0 10*3/uL (ref 0.0–0.2)
EOS (ABSOLUTE): 0.3 10*3/uL (ref 0.0–0.4)
Eos: 6 %
Hematocrit: 40.9 % (ref 37.5–51.0)
Hemoglobin: 14.2 g/dL (ref 13.0–17.7)
IMMATURE GRANS (ABS): 0 10*3/uL (ref 0.0–0.1)
IMMATURE GRANULOCYTES: 0 %
LYMPHS: 25 %
Lymphocytes Absolute: 1.2 10*3/uL (ref 0.7–3.1)
MCH: 29.8 pg (ref 26.6–33.0)
MCHC: 34.7 g/dL (ref 31.5–35.7)
MCV: 86 fL (ref 79–97)
Monocytes Absolute: 0.5 10*3/uL (ref 0.1–0.9)
Monocytes: 9 %
NEUTROS PCT: 59 %
Neutrophils Absolute: 2.9 10*3/uL (ref 1.4–7.0)
PLATELETS: 273 10*3/uL (ref 150–379)
RBC: 4.76 x10E6/uL (ref 4.14–5.80)
RDW: 13.8 % (ref 12.3–15.4)
WBC: 4.9 10*3/uL (ref 3.4–10.8)

## 2017-12-18 LAB — PRO B NATRIURETIC PEPTIDE: NT-PRO BNP: 89 pg/mL (ref 0–486)

## 2017-12-18 LAB — LIPID PANEL
CHOL/HDL RATIO: 4.9 ratio (ref 0.0–5.0)
Cholesterol, Total: 238 mg/dL — ABNORMAL HIGH (ref 100–199)
HDL: 49 mg/dL (ref >39)
LDL Calculated: 164 mg/dL — ABNORMAL HIGH (ref 0–99)
TRIGLYCERIDES: 126 mg/dL (ref 0–149)
VLDL CHOLESTEROL CAL: 25 mg/dL (ref 5–40)

## 2017-12-18 LAB — TSH: TSH: 2.06 u[IU]/mL (ref 0.450–4.500)

## 2017-12-18 NOTE — Telephone Encounter (Signed)
Patient advised as below.  

## 2017-12-18 NOTE — Telephone Encounter (Signed)
-----   Message from Jerrol Banana., MD sent at 12/18/2017  1:45 PM EDT ----- Labs ok.

## 2017-12-20 ENCOUNTER — Other Ambulatory Visit: Payer: Self-pay | Admitting: Family Medicine

## 2017-12-20 DIAGNOSIS — E7849 Other hyperlipidemia: Secondary | ICD-10-CM

## 2018-01-16 ENCOUNTER — Ambulatory Visit (INDEPENDENT_AMBULATORY_CARE_PROVIDER_SITE_OTHER): Payer: Medicare Other | Admitting: Family Medicine

## 2018-01-16 VITALS — BP 140/68 | HR 68 | Temp 98.5°F | Resp 14 | Wt 204.0 lb

## 2018-01-16 DIAGNOSIS — I10 Essential (primary) hypertension: Secondary | ICD-10-CM | POA: Diagnosis not present

## 2018-01-16 DIAGNOSIS — K219 Gastro-esophageal reflux disease without esophagitis: Secondary | ICD-10-CM | POA: Diagnosis not present

## 2018-01-16 NOTE — Progress Notes (Signed)
Patient: Adam Hanna. Male    DOB: [birth year]   84 y.o.   MRN: [id number] Visit Date: 01/16/2018  Today's Provider: Wilhemena Durie, MD   Chief Complaint  Patient presents with  . Follow-up   Subjective:    HPI Pt is here for a 1 month follow up on possible BPH vs OAB and hyperlipidemia. He reports that he is not taking the medication anymore because it made his leg swell and him have a dry mouth and he already had a dry mouth before taking it. He reports that it did help and the frequency has not come back. Pt reports that he is tired today but he played 18 holes of golf this morning also.      No Known Allergies   Current Outpatient Medications:  .  ezetimibe (ZETIA) 10 MG tablet, TAKE 1 TABLET EVERY MORNING, Disp: 90 tablet, Rfl: 3 .  naproxen sodium (ALEVE) 220 MG tablet, Take 220 mg by mouth., Disp: , Rfl:  .  NIFEdipine (PROCARDIA-XL/ADALAT-CC/NIFEDICAL-XL) 30 MG 24 hr tablet, Take 2 tablets (60 mg total) by mouth daily., Disp: 180 tablet, Rfl: 3 .  nitroGLYCERIN (NITROSTAT) 0.4 MG SL tablet, Place under the tongue., Disp: , Rfl:  .  oxybutynin (DITROPAN XL) 10 MG 24 hr tablet, Take 1 tablet (10 mg total) by mouth at bedtime., Disp: 90 tablet, Rfl: 3 .  ranitidine (ZANTAC) 150 MG tablet, Take 1 tablet (150 mg total) at bedtime by mouth., Disp: 90 tablet, Rfl: 3  Review of Systems  Constitutional: Positive for fatigue.  HENT: Negative.   Eyes: Negative.   Respiratory: Negative.   Cardiovascular: Negative.   Gastrointestinal: Negative.   Endocrine: Negative.   Genitourinary: Negative.   Musculoskeletal: Negative.   Skin: Negative.   Allergic/Immunologic: Negative.   Neurological: Negative.   Hematological: Negative.   Psychiatric/Behavioral: Negative.     Social History   Tobacco Use  . Smoking status: Former Smoker    Packs/day: 1.00    Years: 20.00    Pack years: 20.00    Types: Cigarettes    Last attempt to quit: 08/21/1963    Years since  quitting: 54.4  . Smokeless tobacco: Never Used  Substance Use Topics  . Alcohol use: Yes    Alcohol/week: 6.6 - 9.0 oz    Types: 4 - 8 Glasses of wine, 7 Standard drinks or equivalent per week   Objective:   BP 140/68 (BP Location: Left Arm, Patient Position: Sitting, Cuff Size: Normal)   Pulse 68   Temp 98.5 F (36.9 C) (Oral)   Resp 14   Wt 204 lb (92.5 kg)   SpO2 97%   BMI 26.91 kg/m  Vitals:   01/16/18 1522  BP: 140/68  Pulse: 68  Resp: 14  Temp: 98.5 F (36.9 C)  TempSrc: Oral  SpO2: 97%  Weight: 204 lb (92.5 kg)     Physical Exam  Constitutional: He is oriented to person, place, and time. He appears well-developed and well-nourished.  HENT:  Head: Normocephalic and atraumatic.  Eyes: Conjunctivae are normal. No scleral icterus.  Neck: Neck supple. No thyromegaly present.  Cardiovascular: Normal rate, regular rhythm and normal heart sounds.  Pulmonary/Chest: Effort normal and breath sounds normal.  Abdominal: Soft.  Musculoskeletal:  Trace edema.  Neurological: He is alert and oriented to person, place, and time.  Skin: Skin is warm and dry.  Psychiatric: He has a normal mood and affect. His behavior is normal. Judgment  and thought content normal.        Assessment & Plan:     BPH Stable. No OAB. HTN RTC 4 months.     I have done the exam and reviewed the above chart and it is accurate to the best of my knowledge. Development worker, community has been used in this note in any air is in the dictation or transcription are unintentional.  Wilhemena Durie, MD  [place]

## 2018-03-18 ENCOUNTER — Ambulatory Visit (INDEPENDENT_AMBULATORY_CARE_PROVIDER_SITE_OTHER): Payer: Medicare Other | Admitting: Family Medicine

## 2018-03-18 ENCOUNTER — Encounter: Payer: Self-pay | Admitting: Family Medicine

## 2018-03-18 ENCOUNTER — Ambulatory Visit (INDEPENDENT_AMBULATORY_CARE_PROVIDER_SITE_OTHER): Payer: Medicare Other

## 2018-03-18 VITALS — BP 138/74 | HR 85 | Temp 98.8°F | Ht 73.0 in | Wt 206.2 lb

## 2018-03-18 DIAGNOSIS — I10 Essential (primary) hypertension: Secondary | ICD-10-CM | POA: Diagnosis not present

## 2018-03-18 DIAGNOSIS — Z0000 Encounter for general adult medical examination without abnormal findings: Secondary | ICD-10-CM

## 2018-03-18 DIAGNOSIS — R5383 Other fatigue: Secondary | ICD-10-CM | POA: Diagnosis not present

## 2018-03-18 NOTE — Patient Instructions (Addendum)
[name] , Thank you for taking time to come for your Medicare Wellness Visit. I appreciate your ongoing commitment to your health goals. Please review the following plan we discussed and let me know if I can assist you in the future.   Screening recommendations/referrals: Colonoscopy: Up to date Recommended yearly ophthalmology/optometry visit for glaucoma screening and checkup Recommended yearly dental visit for hygiene and checkup  Vaccinations: Influenza vaccine: N/A Pneumococcal vaccine: Up to date Tdap vaccine: Up to date Shingles vaccine: Pt declines today.     Advanced directives: Already on file.  Conditions/risks identified: Recommend increasing water intake to 4 glasses of water a day.   Next appointment: 2:00 PM today with Dr Adam Hanna.  Preventive Care 53 Years and Older, Male Preventive care refers to lifestyle choices and visits with your health care provider that can promote health and wellness. What does preventive care include?  A yearly physical exam. This is also called an annual well check.  Dental exams once or twice a year.  Routine eye exams. Ask your health care provider how often you should have your eyes checked.  Personal lifestyle choices, including:  Daily care of your teeth and gums.  Regular physical activity.  Eating a healthy diet.  Avoiding tobacco and drug use.  Limiting alcohol use.  Practicing safe sex.  Taking low doses of aspirin every day.  Taking vitamin and mineral supplements as recommended by your health care provider. What happens during an annual well check? The services and screenings done by your health care provider during your annual well check will depend on your age, overall health, lifestyle risk factors, and family history of disease. Counseling  Your health care provider may ask you questions about your:  Alcohol use.  Tobacco use.  Drug use.  Emotional well-being.  Home and relationship  well-being.  Sexual activity.  Eating habits.  History of falls.  Memory and ability to understand (cognition).  Work and work Statistician. Screening  You may have the following tests or measurements:  Height, weight, and BMI.  Blood pressure.  Lipid and cholesterol levels. These may be checked every 5 years, or more frequently if you are over 62 years old.  Skin check.  Lung cancer screening. You may have this screening every year starting at age 39 if you have a 30-pack-year history of smoking and currently smoke or have quit within the past 15 years.  Fecal occult blood test (FOBT) of the stool. You may have this test every year starting at age 47.  Flexible sigmoidoscopy or colonoscopy. You may have a sigmoidoscopy every 5 years or a colonoscopy every 10 years starting at age 67.  Prostate cancer screening. Recommendations will vary depending on your family history and other risks.  Hepatitis C blood test.  Hepatitis B blood test.  Sexually transmitted disease (STD) testing.  Diabetes screening. This is done by checking your blood sugar (glucose) after you have not eaten for a while (fasting). You may have this done every 1-3 years.  Abdominal aortic aneurysm (AAA) screening. You may need this if you are a current or former smoker.  Osteoporosis. You may be screened starting at age 79 if you are at high risk. Talk with your health care provider about your test results, treatment options, and if necessary, the need for more tests. Vaccines  Your health care provider may recommend certain vaccines, such as:  Influenza vaccine. This is recommended every year.  Tetanus, diphtheria, and acellular pertussis (Tdap, Td) vaccine.  You may need a Td booster every 10 years.  Zoster vaccine. You may need this after age 85.  Pneumococcal 13-valent conjugate (PCV13) vaccine. One dose is recommended after age 75.  Pneumococcal polysaccharide (PPSV23) vaccine. One dose is  recommended after age 44. Talk to your health care provider about which screenings and vaccines you need and how often you need them. This information is not intended to replace advice given to you by your health care provider. Make sure you discuss any questions you have with your health care provider. Document Released: 09/03/2015 Document Revised: 04/26/2016 Document Reviewed: 06/08/2015 Elsevier Interactive Patient Education  2017 [place] Prevention in the Home Falls can cause injuries. They can happen to people of all ages. There are many things you can do to make your home safe and to help prevent falls. What can I do on the outside of my home?  Regularly fix the edges of walkways and driveways and fix any cracks.  Remove anything that might make you trip as you walk through a door, such as a raised step or threshold.  Trim any bushes or trees on the path to your home.  Use bright outdoor lighting.  Clear any walking paths of anything that might make someone trip, such as rocks or tools.  Regularly check to see if handrails are loose or broken. Make sure that both sides of any steps have handrails.  Any raised decks and porches should have guardrails on the edges.  Have any leaves, snow, or ice cleared regularly.  Use sand or salt on walking paths during winter.  Clean up any spills in your garage right away. This includes oil or grease spills. What can I do in the bathroom?  Use night lights.  Install grab bars by the toilet and in the tub and shower. Do not use towel bars as grab bars.  Use non-skid mats or decals in the tub or shower.  If you need to sit down in the shower, use a plastic, non-slip stool.  Keep the floor dry. Clean up any water that spills on the floor as soon as it happens.  Remove soap buildup in the tub or shower regularly.  Attach bath mats securely with double-sided non-slip rug tape.  Do not have throw rugs and other things on  the floor that can make you trip. What can I do in the bedroom?  Use night lights.  Make sure that you have a light by your bed that is easy to reach.  Do not use any sheets or blankets that are too big for your bed. They should not hang down onto the floor.  Have a firm chair that has side arms. You can use this for support while you get dressed.  Do not have throw rugs and other things on the floor that can make you trip. What can I do in the kitchen?  Clean up any spills right away.  Avoid walking on wet floors.  Keep items that you use a lot in easy-to-reach places.  If you need to reach something above you, use a strong step stool that has a grab bar.  Keep electrical cords out of the way.  Do not use floor polish or wax that makes floors slippery. If you must use wax, use non-skid floor wax.  Do not have throw rugs and other things on the floor that can make you trip. What can I do with my stairs?  Do not leave any  items on the stairs.  Make sure that there are handrails on both sides of the stairs and use them. Fix handrails that are broken or loose. Make sure that handrails are as long as the stairways.  Check any carpeting to make sure that it is firmly attached to the stairs. Fix any carpet that is loose or worn.  Avoid having throw rugs at the top or bottom of the stairs. If you do have throw rugs, attach them to the floor with carpet tape.  Make sure that you have a light switch at the top of the stairs and the bottom of the stairs. If you do not have them, ask someone to add them for you. What else can I do to help prevent falls?  Wear shoes that:  Do not have high heels.  Have rubber bottoms.  Are comfortable and fit you well.  Are closed at the toe. Do not wear sandals.  If you use a stepladder:  Make sure that it is fully opened. Do not climb a closed stepladder.  Make sure that both sides of the stepladder are locked into place.  Ask someone to  hold it for you, if possible.  Clearly mark and make sure that you can see:  Any grab bars or handrails.  First and last steps.  Where the edge of each step is.  Use tools that help you move around (mobility aids) if they are needed. These include:  Canes.  Walkers.  Scooters.  Crutches.  Turn on the lights when you go into a dark area. Replace any light bulbs as soon as they burn out.  Set up your furniture so you have a clear path. Avoid moving your furniture around.  If any of your floors are uneven, fix them.  If there are any pets around you, be aware of where they are.  Review your medicines with your doctor. Some medicines can make you feel dizzy. This can increase your chance of falling. Ask your doctor what other things that you can do to help prevent falls. This information is not intended to replace advice given to you by your health care provider. Make sure you discuss any questions you have with your health care provider. Document Released: 06/03/2009 Document Revised: 01/13/2016 Document Reviewed: 09/11/2014 Elsevier Interactive Patient Education  2017 [employer].

## 2018-03-18 NOTE — Progress Notes (Signed)
Subjective:   Adam Hanna. is a 84 y.o. male who presents for Medicare Annual/Subsequent preventive examination.  Review of Systems:  N/A  Cardiac Risk Factors include: advanced age (>41men, >89 women);hypertension;male gender;dyslipidemia     Objective:    Vitals: BP 138/74 (BP Location: Right Arm)   Pulse 85   Temp 98.8 F (37.1 C) (Oral)   Ht 6\' 1"  (1.854 m)   Wt 206 lb 3.2 oz (93.5 kg)   BMI 27.20 kg/m   Body mass index is 27.2 kg/m.  Advanced Directives 03/18/2018 03/16/2017 02/22/2016 02/23/2015  Does Patient Have a Medical Advance Directive? Yes Yes Yes Yes  Type of Paramedic of [place];Living will [place];Living will [place];Living will -  Does patient want to make changes to medical advance directive? - - No - Patient declined -  Copy of [place] in Chart? Yes Yes No - copy requested -    Tobacco Social History   Tobacco Use  Smoking Status Former Smoker  . Packs/day: 1.00  . Years: 20.00  . Pack years: 20.00  . Types: Cigarettes  . Last attempt to quit: 08/21/1963  . Years since quitting: 54.6  Smokeless Tobacco Never Used     Counseling given: Not Answered   Clinical Intake:  Pre-visit preparation completed: Yes  Pain : No/denies pain Pain Score: 0-No pain     Nutritional Status: BMI 25 -29 Overweight Nutritional Risks: None Diabetes: No  How often do you need to have someone help you when you read instructions, pamphlets, or other written materials from your doctor or pharmacy?: 1 - Never  Interpreter Needed?: No  Information entered by :: [facility], LPN  Past Medical History:  Diagnosis Date  . Bone spur   . GERD (gastroesophageal reflux disease)   . Hyperlipidemia   . Hypertension    Past Surgical History:  Procedure Laterality Date  . BACK SURGERY    . CATARACT EXTRACTION Bilateral   . CYST REMOVAL NECK    . MELANOMA EXCISION      Family History  Problem Relation Age of Onset  . Congestive Heart Failure Mother   . Dementia Father   . Other Sister        Suicide by gunshot wound   Social History   Socioeconomic History  . Marital status: Married    Spouse name: Not on file  . Number of children: 2  . Years of education: Not on file  . Highest education level: Bachelor's degree (e.g., BA, AB, BS)  Occupational History  . Occupation: retired  Scientific laboratory technician  . Financial resource strain: Not hard at all  . Food insecurity:    Worry: Never true    Inability: Never true  . Transportation needs:    Medical: No    Non-medical: No  Tobacco Use  . Smoking status: Former Smoker    Packs/day: 1.00    Years: 20.00    Pack years: 20.00    Types: Cigarettes    Last attempt to quit: 08/21/1963    Years since quitting: 54.6  . Smokeless tobacco: Never Used  Substance and Sexual Activity  . Alcohol use: Yes    Alcohol/week: 4.2 - 10.2 oz    Types: 7 Standard drinks or equivalent per week  . Drug use: No  . Sexual activity: Not on file  Lifestyle  . Physical activity:    Days per week: Not on file  Minutes per session: Not on file  . Stress: Not at all  Relationships  . Social connections:    Talks on phone: Not on file    Gets together: Not on file    Attends religious service: Not on file    Active member of club or organization: Not on file    Attends meetings of clubs or organizations: Not on file    Relationship status: Not on file  Other Topics Concern  . Not on file  Social History Narrative  . Not on file    Outpatient Encounter Medications as of 03/18/2018  Medication Sig  . naproxen sodium (ALEVE) 220 MG tablet Take 220 mg by mouth 2 (two) times daily.   Marland Kitchen NIFEdipine (PROCARDIA-XL/ADALAT-CC/NIFEDICAL-XL) 30 MG 24 hr tablet Take 2 tablets (60 mg total) by mouth daily. (Patient taking differently: Take 30 mg by mouth 2 (two) times daily. )  . ranitidine (ZANTAC) 150 MG tablet Take 1  tablet (150 mg total) at bedtime by mouth.  . ezetimibe (ZETIA) 10 MG tablet TAKE 1 TABLET EVERY MORNING (Patient not taking: Reported on 01/16/2018)  . nitroGLYCERIN (NITROSTAT) 0.4 MG SL tablet Place 0.4 mg under the tongue every 5 (five) minutes as needed.   Marland Kitchen oxybutynin (DITROPAN XL) 10 MG 24 hr tablet Take 1 tablet (10 mg total) by mouth at bedtime. (Patient not taking: Reported on 01/16/2018)   No facility-administered encounter medications on file as of 03/18/2018.     Activities of Daily Living In your present state of health, do you have any difficulty performing the following activities: 03/18/2018  Hearing? N  Vision? N  Difficulty concentrating or making decisions? N  Walking or climbing stairs? N  Dressing or bathing? N  Doing errands, shopping? N  Preparing Food and eating ? N  Using the Toilet? N  In the past six months, have you accidently leaked urine? Y  Comment Occasionally- does not wear protection.   Do you have problems with loss of bowel control? Y  Comment Rarely  Managing your Medications? N  Managing your Finances? N  Housekeeping or managing your Housekeeping? N  Some recent data might be hidden    Patient Care Team: Jerrol Banana., MD as PCP - General (Family Medicine) Ralene Bathe, MD as Consulting Physician (Dermatology)   Assessment:   This is a routine wellness examination for Jahree.  Exercise Activities and Dietary recommendations Current Exercise Habits: Structured exercise class(also does daily back stretching), Type of exercise: stretching;strength training/weights, Time (Minutes): 30, Frequency (Times/Week): 1, Weekly Exercise (Minutes/Week): 30, Intensity: Mild  Goals    . DIET - INCREASE WATER INTAKE     Recommend increasing water intake to 4 glasses of water a day.        Fall Risk Fall Risk  03/18/2018 03/16/2017 03/15/2016  Falls in the past year? No No No   Is the patient's home free of loose throw rugs in walkways,  pet beds, electrical cords, etc?   yes      Grab bars in the bathroom? yes      Handrails on the stairs?   yes      Adequate lighting?   yes  Timed Get Up and Go Performed: N/A  Depression Screen PHQ 2/9 Scores 03/18/2018 03/16/2017 03/15/2016  PHQ - 2 Score 0 0 0    Cognitive Function     6CIT Screen 03/18/2018 03/16/2017  What Year? 0 points 0 points  What month? 0 points 0 points  What time? 0 points 0 points  Count back from 20 0 points 0 points  Months in reverse 0 points 0 points  Repeat phrase 0 points 4 points  Total Score 0 4    Immunization History  Administered Date(s) Administered  . Pneumococcal Conjugate-13 02/04/2014  . Pneumococcal Polysaccharide-23 06/23/2005  . Tdap 07/31/2013  . Zoster 06/19/2006    Qualifies for Shingles Vaccine? Due for Shingles vaccine. Declined my offer to administer today. Education has been provided regarding the importance of this vaccine. Pt has been advised to call her insurance company to determine her out of pocket expense. Advised she may also receive this vaccine at her local pharmacy or Health Dept. Verbalized acceptance and understanding.  Screening Tests Health Maintenance  Topic Date Due  . INFLUENZA VACCINE  03/21/2018  . TETANUS/TDAP  08/01/2023  . PNA vac Low Risk Adult  Completed   Cancer Screenings: Lung: Low Dose CT Chest recommended if Age 104-80 years, 30 pack-year currently smoking OR have quit w/in 15years. Patient does not qualify. Colorectal: Up to date  Additional Screenings:  Hepatitis C Screening: N/A      Plan:  I have personally reviewed and addressed the Medicare Annual Wellness questionnaire and have noted the following in the patient's chart:  A. Medical and social history B. Use of alcohol, tobacco or illicit drugs  C. Current medications and supplements D. Functional ability and status E.  Nutritional status F.  Physical activity G. Advance directives H. List of other physicians I.   Hospitalizations, surgeries, and ER visits in previous 12 months J.  [place] such as hearing and vision if needed, cognitive and depression L. Referrals and appointments - none  In addition, I have reviewed and discussed with patient certain preventive protocols, quality metrics, and best practice recommendations. A written personalized care plan for preventive services as well as general preventive health recommendations were provided to patient.  See attached scanned questionnaire for additional information.   Signed,  Fabio Neighbors, LPN Nurse Health Advisor   Nurse Recommendations: None.

## 2018-03-18 NOTE — Progress Notes (Signed)
Patient: Adam Hanna. Male    DOB: [birth year]   84 y.o.   MRN: [id number] Visit Date: 03/18/2018  Today's Provider: Wilhemena Durie, MD    Subjective:   Patient saw Alyson Ingles today for AWV.   HPI  Hypertension, follow-up:  BP Readings from Last 3 Encounters:  03/18/18 138/74  01/16/18 140/68  12/17/17 132/74    He was last seen for hypertension 2 months ago.  BP at that visit was 140/68. Management since that visit includes no changes. He reports good compliance with treatment.  He is not exercising. However, patient reports that he does stay active. Plays golf occasionally.  He is adherent to low salt diet.   Outside blood pressures are checked occasionally. He is experiencing fatigue.  Patient denies exertional chest pressure/discomfort, lower extremity edema and palpitations.   Cardiovascular risk factors include dyslipidemia.   Weight trend: stable Wt Readings from Last 3 Encounters:  03/18/18 206 lb 3.2 oz (93.5 kg)  01/16/18 204 lb (92.5 kg)  12/17/17 206 lb 3.2 oz (93.5 kg)    Current diet: well balanced  Fatigue Patient reports that over the last 2 weeks, he has experienced severe fatigue. He has noticed himself taking more naps through out the day. He reports that he averages 4-5 hours of sleep every night, but reports that this is normal for him. He wakes up between 2-5 times a night to use the bathroom. Patient does not have any trouble going to sleep, he can not stay asleep. No history of OSA. No other complaints. He still plays golf twice a week.    No Known Allergies   Current Outpatient Medications:  .  ezetimibe (ZETIA) 10 MG tablet, TAKE 1 TABLET EVERY MORNING (Patient not taking: Reported on 01/16/2018), Disp: 90 tablet, Rfl: 3 .  naproxen sodium (ALEVE) 220 MG tablet, Take 220 mg by mouth 2 (two) times daily. , Disp: , Rfl:  .  NIFEdipine (PROCARDIA-XL/ADALAT-CC/NIFEDICAL-XL) 30 MG 24 hr tablet, Take 2 tablets (60 mg total) by  mouth daily. (Patient taking differently: Take 30 mg by mouth 2 (two) times daily. ), Disp: 180 tablet, Rfl: 3 .  nitroGLYCERIN (NITROSTAT) 0.4 MG SL tablet, Place 0.4 mg under the tongue every 5 (five) minutes as needed. , Disp: , Rfl:  .  oxybutynin (DITROPAN XL) 10 MG 24 hr tablet, Take 1 tablet (10 mg total) by mouth at bedtime. (Patient not taking: Reported on 01/16/2018), Disp: 90 tablet, Rfl: 3 .  ranitidine (ZANTAC) 150 MG tablet, Take 1 tablet (150 mg total) at bedtime by mouth., Disp: 90 tablet, Rfl: 3  Review of Systems  Constitutional: Positive for activity change and fatigue. Negative for appetite change, chills and unexpected weight change.  HENT: Negative.   Respiratory: Negative for cough and shortness of breath.   Cardiovascular: Negative for chest pain and palpitations.  Gastrointestinal: Negative.   Endocrine: Negative for cold intolerance, polydipsia, polyphagia and polyuria.  Genitourinary: Negative.   Musculoskeletal: Negative for arthralgias.  Skin: Negative.   Allergic/Immunologic: Negative for environmental allergies.  Neurological: Negative for dizziness, tremors, syncope, weakness, light-headedness, numbness and headaches.  Psychiatric/Behavioral: Positive for sleep disturbance. Negative for agitation, confusion, decreased concentration and self-injury. The patient is not nervous/anxious and is not hyperactive.     Social History   Tobacco Use  . Smoking status: Former Smoker    Packs/day: 1.00    Years: 20.00    Pack years: 20.00    Types: Cigarettes  Last attempt to quit: 08/21/1963    Years since quitting: 54.6  . Smokeless tobacco: Never Used  Substance Use Topics  . Alcohol use: Yes    Alcohol/week: 4.2 - 10.2 oz    Types: 7 Standard drinks or equivalent per week   Objective:  BP 138/74 (BP Location: Right Arm)   Pulse 85   Temp 98.8 F (37.1 C) (Oral)   Ht 6\' 1"  (1.854 m)   Wt 206 lb 3.2 oz (93.5 kg)   BMI 27.20 kg/m   Body mass index is  27.2 kg/m.   Physical Exam  Constitutional: He is oriented to person, place, and time. He appears well-developed and well-nourished.  HENT:  Head: Normocephalic and atraumatic.  Right Ear: External ear normal.  Left Ear: External ear normal.  Nose: Nose normal.  Eyes: Conjunctivae are normal. No scleral icterus.  Neck: No thyromegaly present.  Cardiovascular: Normal rate, regular rhythm and normal heart sounds.  Pulmonary/Chest: Effort normal and breath sounds normal.  Abdominal: Soft.  Musculoskeletal: He exhibits no edema.  Neurological: He is alert and oriented to person, place, and time.  Skin: Skin is warm and dry.  Miltiple SKs/AKs followed by dermatology.  Psychiatric: He has a normal mood and affect. His behavior is normal. Judgment and thought content normal.        Assessment & Plan:     1. Essential hypertension  - EKG 12-Lead - CBC with Differential/Platelet - Comprehensive metabolic panel  2. Other fatigue Doubt OSA. F/u 4 weeks.ECG ok. - TSH 3.AKs/Sks     I have done the exam and reviewed the above chart and it is accurate to the best of my knowledge. Development worker, community has been used in this note in any air is in the dictation or transcription are unintentional.  Wilhemena Durie, MD  [place]

## 2018-03-19 LAB — CBC WITH DIFFERENTIAL/PLATELET
BASOS: 1 %
Basophils Absolute: 0.1 10*3/uL (ref 0.0–0.2)
EOS (ABSOLUTE): 0.4 10*3/uL (ref 0.0–0.4)
EOS: 7 %
HEMATOCRIT: 43 % (ref 37.5–51.0)
Hemoglobin: 14 g/dL (ref 13.0–17.7)
Immature Grans (Abs): 0 10*3/uL (ref 0.0–0.1)
Immature Granulocytes: 0 %
LYMPHS ABS: 1.4 10*3/uL (ref 0.7–3.1)
Lymphs: 25 %
MCH: 28.6 pg (ref 26.6–33.0)
MCHC: 32.6 g/dL (ref 31.5–35.7)
MCV: 88 fL (ref 79–97)
MONOS ABS: 0.5 10*3/uL (ref 0.1–0.9)
Monocytes: 9 %
Neutrophils Absolute: 3.2 10*3/uL (ref 1.4–7.0)
Neutrophils: 58 %
PLATELETS: 293 10*3/uL (ref 150–450)
RBC: 4.9 x10E6/uL (ref 4.14–5.80)
RDW: 13.4 % (ref 12.3–15.4)
WBC: 5.6 10*3/uL (ref 3.4–10.8)

## 2018-03-19 LAB — COMPREHENSIVE METABOLIC PANEL
A/G RATIO: 1.6 (ref 1.2–2.2)
ALK PHOS: 145 IU/L — AB (ref 39–117)
ALT: 29 IU/L (ref 0–44)
AST: 33 IU/L (ref 0–40)
Albumin: 4.4 g/dL (ref 3.5–4.7)
BILIRUBIN TOTAL: 0.2 mg/dL (ref 0.0–1.2)
BUN/Creatinine Ratio: 21 (ref 10–24)
BUN: 22 mg/dL (ref 8–27)
CHLORIDE: 103 mmol/L (ref 96–106)
CO2: 25 mmol/L (ref 20–29)
Calcium: 9 mg/dL (ref 8.6–10.2)
Creatinine, Ser: 1.05 mg/dL (ref 0.76–1.27)
GFR calc Af Amer: 72 mL/min/{1.73_m2} (ref >59)
GFR calc non Af Amer: 63 mL/min/{1.73_m2} (ref >59)
GLOBULIN, TOTAL: 2.7 g/dL (ref 1.5–4.5)
Glucose: 84 mg/dL (ref 65–99)
POTASSIUM: 4.6 mmol/L (ref 3.5–5.2)
SODIUM: 142 mmol/L (ref 134–144)
Total Protein: 7.1 g/dL (ref 6.0–8.5)

## 2018-03-19 LAB — TSH: TSH: 2.03 u[IU]/mL (ref 0.450–4.500)

## 2018-05-16 ENCOUNTER — Ambulatory Visit: Payer: Self-pay | Admitting: Family Medicine

## 2018-06-13 DIAGNOSIS — L570 Actinic keratosis: Secondary | ICD-10-CM | POA: Diagnosis not present

## 2018-06-13 DIAGNOSIS — D1800 Hemangioma unspecified site: Secondary | ICD-10-CM | POA: Diagnosis not present

## 2018-06-13 DIAGNOSIS — L820 Inflamed seborrheic keratosis: Secondary | ICD-10-CM | POA: Diagnosis not present

## 2018-06-13 DIAGNOSIS — D229 Melanocytic nevi, unspecified: Secondary | ICD-10-CM | POA: Diagnosis not present

## 2018-06-13 DIAGNOSIS — Z85828 Personal history of other malignant neoplasm of skin: Secondary | ICD-10-CM | POA: Diagnosis not present

## 2018-06-13 DIAGNOSIS — L821 Other seborrheic keratosis: Secondary | ICD-10-CM | POA: Diagnosis not present

## 2018-06-13 DIAGNOSIS — Z1283 Encounter for screening for malignant neoplasm of skin: Secondary | ICD-10-CM | POA: Diagnosis not present

## 2018-06-13 DIAGNOSIS — L304 Erythema intertrigo: Secondary | ICD-10-CM | POA: Diagnosis not present

## 2018-06-13 DIAGNOSIS — Z85820 Personal history of malignant melanoma of skin: Secondary | ICD-10-CM | POA: Diagnosis not present

## 2018-06-13 DIAGNOSIS — L578 Other skin changes due to chronic exposure to nonionizing radiation: Secondary | ICD-10-CM | POA: Diagnosis not present

## 2018-06-18 ENCOUNTER — Ambulatory Visit (INDEPENDENT_AMBULATORY_CARE_PROVIDER_SITE_OTHER): Payer: Medicare Other | Admitting: Family Medicine

## 2018-06-18 ENCOUNTER — Encounter: Payer: Self-pay | Admitting: Family Medicine

## 2018-06-18 VITALS — BP 124/84 | HR 64 | Temp 97.7°F | Resp 16 | Wt 203.0 lb

## 2018-06-18 DIAGNOSIS — Z23 Encounter for immunization: Secondary | ICD-10-CM

## 2018-06-18 DIAGNOSIS — E785 Hyperlipidemia, unspecified: Secondary | ICD-10-CM | POA: Diagnosis not present

## 2018-06-18 DIAGNOSIS — I10 Essential (primary) hypertension: Secondary | ICD-10-CM

## 2018-06-18 DIAGNOSIS — D485 Neoplasm of uncertain behavior of skin: Secondary | ICD-10-CM | POA: Diagnosis not present

## 2018-06-18 DIAGNOSIS — L720 Epidermal cyst: Secondary | ICD-10-CM | POA: Diagnosis not present

## 2018-06-18 MED ORDER — NIFEDIPINE ER OSMOTIC RELEASE 30 MG PO TB24: 30.0000 mg | ORAL_TABLET | Freq: Two times a day (BID) | ORAL | 1 refills | Status: DC

## 2018-06-18 NOTE — Progress Notes (Signed)
Patient: Adam Hanna. Male    DOB: February 11, 1926   84 y.o.   MRN: [id number] Visit Date: 06/18/2018  Today's Provider: Wilhemena Durie, MD   Chief Complaint  Patient presents with  . Hypertension   Subjective:    Hypertension  This is a chronic problem. The problem is unchanged. The problem is controlled. Associated symptoms include malaise/fatigue and peripheral edema. Pertinent negatives include no anxiety, blurred vision, chest pain, headaches, neck pain, orthopnea, palpitations, PND, shortness of breath or sweats. There are no associated agents to hypertension. There are no compliance problems.      BP Readings from Last 3 Encounters:  06/18/18 124/84  03/18/18 138/74  01/16/18 140/68   Wt Readings from Last 3 Encounters:  06/18/18 203 lb (92.1 kg)  03/18/18 206 lb 3.2 oz (93.5 kg)  01/16/18 204 lb (92.5 kg)   Lab Results  Component Value Date   CHOL 238 (H) 12/17/2017   CHOL 236 (H) 01/17/2017   CHOL 249 (H) 06/20/2016   Lab Results  Component Value Date   HDL 49 12/17/2017   HDL 50 01/17/2017   HDL 42 06/20/2016   Lab Results  Component Value Date   LDLCALC 164 (H) 12/17/2017   LDLCALC 156 (H) 01/17/2017   LDLCALC 178 (H) 06/20/2016   Lab Results  Component Value Date   TRIG 126 12/17/2017   TRIG 148 01/17/2017   TRIG 145 06/20/2016   Lab Results  Component Value Date   CHOLHDL 4.9 12/17/2017   No results found for: LDLDIRECT     No Known Allergies   Current Outpatient Medications:  .  naproxen sodium (ALEVE) 220 MG tablet, Take 220 mg by mouth 2 (two) times daily. , Disp: , Rfl:  .  NIFEdipine (PROCARDIA-XL/ADALAT-CC/NIFEDICAL-XL) 30 MG 24 hr tablet, Take 2 tablets (60 mg total) by mouth daily. (Patient taking differently: Take 30 mg by mouth 2 (two) times daily. ), Disp: 180 tablet, Rfl: 3 .  nitroGLYCERIN (NITROSTAT) 0.4 MG SL tablet, Place 0.4 mg under the tongue every 5 (five) minutes as needed. , Disp: , Rfl:  .  ranitidine  (ZANTAC) 150 MG tablet, Take 1 tablet (150 mg total) at bedtime by mouth., Disp: 90 tablet, Rfl: 3 .  ezetimibe (ZETIA) 10 MG tablet, TAKE 1 TABLET EVERY MORNING (Patient not taking: Reported on 01/16/2018), Disp: 90 tablet, Rfl: 3 .  oxybutynin (DITROPAN XL) 10 MG 24 hr tablet, Take 1 tablet (10 mg total) by mouth at bedtime. (Patient not taking: Reported on 01/16/2018), Disp: 90 tablet, Rfl: 3  Review of Systems  Constitutional: Positive for fatigue and malaise/fatigue. Negative for activity change, appetite change, chills, diaphoresis, fever and unexpected weight change.  HENT: Negative.   Eyes: Negative.  Negative for blurred vision.  Respiratory: Negative.  Negative for shortness of breath.   Cardiovascular: Negative.  Negative for chest pain, palpitations, orthopnea and PND.  Gastrointestinal: Negative.   Endocrine: Negative.   Musculoskeletal: Positive for myalgias (Bilateral upper legs (Just above the knees)). Negative for arthralgias, back pain, gait problem, joint swelling, neck pain and neck stiffness.  Allergic/Immunologic: Negative.   Neurological: Negative for headaches.  Psychiatric/Behavioral: Negative.     Social History   Tobacco Use  . Smoking status: Former Smoker    Packs/day: 1.00    Years: 20.00    Pack years: 20.00    Types: Cigarettes    Last attempt to quit: 08/21/1963    Years since quitting: 54.8  .  Smokeless tobacco: Never Used  Substance Use Topics  . Alcohol use: Yes    Alcohol/week: 7.0 - 17.0 standard drinks    Types: 7 Standard drinks or equivalent per week   Objective:   BP 124/84 (BP Location: Right Arm, Patient Position: Sitting, Cuff Size: Normal)   Pulse 64   Temp 97.7 F (36.5 C) (Oral)   Resp 16   Wt 203 lb (92.1 kg)   BMI 26.78 kg/m  Vitals:   06/18/18 0928  BP: 124/84  Pulse: 64  Resp: 16  Temp: 97.7 F (36.5 C)  TempSrc: Oral  Weight: 203 lb (92.1 kg)     Physical Exam  Constitutional: He is oriented to person, place,  and time. He appears well-developed and well-nourished.  HENT:  Head: Normocephalic and atraumatic.  Right Ear: External ear normal.  Left Ear: External ear normal.  Nose: Nose normal.  Eyes: Conjunctivae are normal. No scleral icterus.  Neck: Neck supple. No thyromegaly present.  Cardiovascular: Normal rate, regular rhythm and normal heart sounds.  Pulmonary/Chest: Effort normal and breath sounds normal.  Abdominal: Soft.  Musculoskeletal: He exhibits edema.  1+ edema.  Neurological: He is alert and oriented to person, place, and time.  Skin: Skin is warm and dry.  Psychiatric: He has a normal mood and affect. His behavior is normal. Judgment and thought content normal.        Assessment & Plan:     .1. Essential hypertension Stable. - NIFEdipine (PROCARDIA-XL/NIFEDICAL-XL) 30 MG 24 hr tablet; Take 1 tablet (30 mg total) by mouth 2 (two) times daily.  Dispense: 180 tablet; Refill: 1  2. Need for influenza vaccination  - Flu vaccine HIGH DOSE PF (Fluzone High dose)  3. Dyslipidemia  I have done the exam and reviewed the chart and it is accurate to the best of my knowledge. Development worker, community has been used and  any errors in dictation or transcription are unintentional. Miguel Aschoff M.D. [place], MD  [facility]

## 2018-07-02 DIAGNOSIS — M2041 Other hammer toe(s) (acquired), right foot: Secondary | ICD-10-CM | POA: Diagnosis not present

## 2018-07-02 DIAGNOSIS — M2042 Other hammer toe(s) (acquired), left foot: Secondary | ICD-10-CM | POA: Insufficient documentation

## 2018-07-08 DIAGNOSIS — L720 Epidermal cyst: Secondary | ICD-10-CM | POA: Diagnosis not present

## 2018-07-29 DIAGNOSIS — L720 Epidermal cyst: Secondary | ICD-10-CM | POA: Diagnosis not present

## 2018-07-31 DIAGNOSIS — M25561 Pain in right knee: Secondary | ICD-10-CM | POA: Diagnosis not present

## 2018-07-31 DIAGNOSIS — M25562 Pain in left knee: Secondary | ICD-10-CM | POA: Diagnosis not present

## 2018-07-31 DIAGNOSIS — M6281 Muscle weakness (generalized): Secondary | ICD-10-CM | POA: Diagnosis not present

## 2018-08-09 DIAGNOSIS — R2689 Other abnormalities of gait and mobility: Secondary | ICD-10-CM | POA: Diagnosis not present

## 2018-08-09 DIAGNOSIS — M6281 Muscle weakness (generalized): Secondary | ICD-10-CM | POA: Diagnosis not present

## 2018-08-12 DIAGNOSIS — L97511 Non-pressure chronic ulcer of other part of right foot limited to breakdown of skin: Secondary | ICD-10-CM | POA: Diagnosis not present

## 2018-08-12 DIAGNOSIS — M79674 Pain in right toe(s): Secondary | ICD-10-CM | POA: Diagnosis not present

## 2018-08-12 DIAGNOSIS — B351 Tinea unguium: Secondary | ICD-10-CM | POA: Diagnosis not present

## 2018-08-12 DIAGNOSIS — M205X1 Other deformities of toe(s) (acquired), right foot: Secondary | ICD-10-CM | POA: Diagnosis not present

## 2018-08-12 DIAGNOSIS — G8929 Other chronic pain: Secondary | ICD-10-CM | POA: Diagnosis not present

## 2018-08-12 DIAGNOSIS — M79675 Pain in left toe(s): Secondary | ICD-10-CM | POA: Diagnosis not present

## 2018-08-19 DIAGNOSIS — M6281 Muscle weakness (generalized): Secondary | ICD-10-CM | POA: Diagnosis not present

## 2018-08-19 DIAGNOSIS — R2689 Other abnormalities of gait and mobility: Secondary | ICD-10-CM | POA: Diagnosis not present

## 2018-10-02 DIAGNOSIS — M6281 Muscle weakness (generalized): Secondary | ICD-10-CM | POA: Diagnosis not present

## 2018-10-24 DIAGNOSIS — R070 Pain in throat: Secondary | ICD-10-CM | POA: Diagnosis not present

## 2018-10-24 DIAGNOSIS — H6122 Impacted cerumen, left ear: Secondary | ICD-10-CM | POA: Diagnosis not present

## 2018-11-04 DIAGNOSIS — M6281 Muscle weakness (generalized): Secondary | ICD-10-CM | POA: Diagnosis not present

## 2018-11-04 DIAGNOSIS — M5416 Radiculopathy, lumbar region: Secondary | ICD-10-CM | POA: Diagnosis not present

## 2018-11-05 DIAGNOSIS — M6281 Muscle weakness (generalized): Secondary | ICD-10-CM | POA: Diagnosis not present

## 2018-11-05 DIAGNOSIS — M5416 Radiculopathy, lumbar region: Secondary | ICD-10-CM | POA: Diagnosis not present

## 2018-11-12 ENCOUNTER — Other Ambulatory Visit: Payer: Self-pay | Admitting: Physical Medicine and Rehabilitation

## 2018-11-12 ENCOUNTER — Other Ambulatory Visit (HOSPITAL_COMMUNITY): Payer: Self-pay | Admitting: Physical Medicine and Rehabilitation

## 2018-11-12 DIAGNOSIS — M6281 Muscle weakness (generalized): Secondary | ICD-10-CM

## 2018-11-12 DIAGNOSIS — M5416 Radiculopathy, lumbar region: Secondary | ICD-10-CM

## 2018-11-14 ENCOUNTER — Ambulatory Visit: Payer: Medicare Other | Admitting: Family Medicine

## 2018-12-10 ENCOUNTER — Ambulatory Visit: Admission: RE | Admit: 2018-12-10 | Payer: Medicare Other | Source: Ambulatory Visit

## 2018-12-19 ENCOUNTER — Telehealth: Payer: Self-pay | Admitting: Family Medicine

## 2018-12-19 MED ORDER — PANTOPRAZOLE SODIUM 20 MG PO TBEC: 20.0000 mg | DELAYED_RELEASE_TABLET | Freq: Every day | ORAL | 3 refills | Status: DC

## 2018-12-19 NOTE — Telephone Encounter (Signed)
Per  Dr. Rosanna Randy, will send in pantoprazole 20mg  daily into the pharmacy. Patient was advised.

## 2018-12-19 NOTE — Telephone Encounter (Signed)
Pt has been taking Zantac 150 mg for acid reflux.  He is not going to take this anymore and would like a rx for something for acid reflux  [place]  CB#  [phone]  Thanks Con Memos

## 2019-01-06 DIAGNOSIS — Z85828 Personal history of other malignant neoplasm of skin: Secondary | ICD-10-CM | POA: Diagnosis not present

## 2019-01-06 DIAGNOSIS — L821 Other seborrheic keratosis: Secondary | ICD-10-CM | POA: Diagnosis not present

## 2019-01-06 DIAGNOSIS — L570 Actinic keratosis: Secondary | ICD-10-CM | POA: Diagnosis not present

## 2019-01-06 DIAGNOSIS — Z1283 Encounter for screening for malignant neoplasm of skin: Secondary | ICD-10-CM | POA: Diagnosis not present

## 2019-01-06 DIAGNOSIS — D485 Neoplasm of uncertain behavior of skin: Secondary | ICD-10-CM | POA: Diagnosis not present

## 2019-01-06 DIAGNOSIS — L820 Inflamed seborrheic keratosis: Secondary | ICD-10-CM | POA: Diagnosis not present

## 2019-01-06 DIAGNOSIS — D225 Melanocytic nevi of trunk: Secondary | ICD-10-CM | POA: Diagnosis not present

## 2019-01-06 DIAGNOSIS — Z85820 Personal history of malignant melanoma of skin: Secondary | ICD-10-CM | POA: Diagnosis not present

## 2019-01-06 DIAGNOSIS — C4492 Squamous cell carcinoma of skin, unspecified: Secondary | ICD-10-CM

## 2019-01-06 DIAGNOSIS — C44329 Squamous cell carcinoma of skin of other parts of face: Secondary | ICD-10-CM | POA: Diagnosis not present

## 2019-01-06 HISTORY — DX: Squamous cell carcinoma of skin, unspecified: C44.92

## 2019-01-08 DIAGNOSIS — Z85828 Personal history of other malignant neoplasm of skin: Secondary | ICD-10-CM | POA: Diagnosis not present

## 2019-01-08 DIAGNOSIS — D692 Other nonthrombocytopenic purpura: Secondary | ICD-10-CM | POA: Diagnosis not present

## 2019-01-08 DIAGNOSIS — L821 Other seborrheic keratosis: Secondary | ICD-10-CM | POA: Diagnosis not present

## 2019-01-23 ENCOUNTER — Encounter: Payer: Self-pay | Admitting: Family Medicine

## 2019-01-23 ENCOUNTER — Other Ambulatory Visit: Payer: Self-pay

## 2019-01-23 ENCOUNTER — Ambulatory Visit (INDEPENDENT_AMBULATORY_CARE_PROVIDER_SITE_OTHER): Payer: Medicare Other | Admitting: Family Medicine

## 2019-01-23 VITALS — BP 126/68 | HR 68 | Temp 98.7°F | Resp 16 | Ht 73.0 in | Wt 210.0 lb

## 2019-01-23 DIAGNOSIS — R739 Hyperglycemia, unspecified: Secondary | ICD-10-CM

## 2019-01-23 DIAGNOSIS — K219 Gastro-esophageal reflux disease without esophagitis: Secondary | ICD-10-CM

## 2019-01-23 DIAGNOSIS — E785 Hyperlipidemia, unspecified: Secondary | ICD-10-CM

## 2019-01-23 DIAGNOSIS — I10 Essential (primary) hypertension: Secondary | ICD-10-CM | POA: Diagnosis not present

## 2019-01-23 NOTE — Progress Notes (Signed)
Patient: Adam Hanna. Male    DOB: June 21, 1926   85 y.o.   MRN: [id number] Visit Date: 01/23/2019  Today's Provider: Wilhemena Durie, MD   Chief Complaint  Patient presents with  . Hypertension  . Gastroesophageal Reflux   Subjective:     HPI  Hypertension, follow-up:  BP Readings from Last 3 Encounters:  01/23/19 126/68  06/18/18 124/84  03/18/18 138/74    He was last seen for hypertension 6 months ago.  BP at that visit was 124/84. Management since that visit includes no changes. He reports good compliance with treatment. He is not having side effects.  He is not exercising. He is adherent to low salt diet.   Outside blood pressures are checked occasionally. He is experiencing none.  Patient denies exertional chest pressure/discomfort, lower extremity edema and palpitations.    Weight trend: stable Wt Readings from Last 3 Encounters:  01/23/19 210 lb (95.3 kg)  06/18/18 203 lb (92.1 kg)  03/18/18 206 lb 3.2 oz (93.5 kg)    Current diet: well balanced  GERD, follow up: Patient was last seen in the office 6 months ago. Since last visit, he was advised to discontinue Zantac, and start Protonix. He reports that he is tolerating the med changes well.  No Known Allergies   Current Outpatient Medications:  .  naproxen sodium (ALEVE) 220 MG tablet, Take 220 mg by mouth 2 (two) times daily. , Disp: , Rfl:  .  NIFEdipine (PROCARDIA-XL/NIFEDICAL-XL) 30 MG 24 hr tablet, Take 1 tablet (30 mg total) by mouth 2 (two) times daily., Disp: 180 tablet, Rfl: 1 .  nitroGLYCERIN (NITROSTAT) 0.4 MG SL tablet, Place 0.4 mg under the tongue every 5 (five) minutes as needed. , Disp: , Rfl:  .  pantoprazole (PROTONIX) 20 MG tablet, Take 1 tablet (20 mg total) by mouth daily., Disp: 90 tablet, Rfl: 3 .  ezetimibe (ZETIA) 10 MG tablet, TAKE 1 TABLET EVERY MORNING (Patient not taking: Reported on 01/16/2018), Disp: 90 tablet, Rfl: 3 .  oxybutynin (DITROPAN XL) 10 MG 24 hr  tablet, Take 1 tablet (10 mg total) by mouth at bedtime. (Patient not taking: Reported on 01/16/2018), Disp: 90 tablet, Rfl: 3 .  ranitidine (ZANTAC) 150 MG tablet, Take 1 tablet (150 mg total) at bedtime by mouth., Disp: 90 tablet, Rfl: 3  Review of Systems  Constitutional: Negative for activity change, appetite change, chills, diaphoresis and unexpected weight change.  Respiratory: Negative for cough and shortness of breath.   Cardiovascular: Negative for chest pain, palpitations and leg swelling.  Gastrointestinal: Negative for abdominal pain, blood in stool, constipation, diarrhea, nausea and rectal pain.  Allergic/Immunologic: Negative for environmental allergies.  Neurological: Negative for dizziness, light-headedness and headaches.  Psychiatric/Behavioral: Negative for agitation, self-injury, sleep disturbance and suicidal ideas. The patient is not nervous/anxious.     Social History   Tobacco Use  . Smoking status: Former Smoker    Packs/day: 1.00    Years: 20.00    Pack years: 20.00    Types: Cigarettes    Last attempt to quit: 08/21/1963    Years since quitting: 55.4  . Smokeless tobacco: Never Used  Substance Use Topics  . Alcohol use: Yes    Alcohol/week: 7.0 - 17.0 standard drinks    Types: 7 Standard drinks or equivalent per week      Objective:   BP 126/68 (BP Location: Right Arm, Patient Position: Sitting, Cuff Size: Normal)   Pulse 68  Temp 98.7 F (37.1 C)   Resp 16   Ht 6\' 1"  (1.854 m)   Wt 210 lb (95.3 kg)   SpO2 98%   BMI 27.71 kg/m  Vitals:   01/23/19 1338  BP: 126/68  Pulse: 68  Resp: 16  Temp: 98.7 F (37.1 C)  SpO2: 98%  Weight: 210 lb (95.3 kg)  Height: 6\' 1"  (1.854 m)     Physical Exam Vitals signs reviewed.  Constitutional:      Appearance: He is well-developed.  HENT:     Head: Normocephalic and atraumatic.     Right Ear: External ear normal.     Left Ear: External ear normal.     Nose: Nose normal.  Eyes:     General: No  scleral icterus.    Conjunctiva/sclera: Conjunctivae normal.  Neck:     Musculoskeletal: Neck supple.     Thyroid: No thyromegaly.  Cardiovascular:     Rate and Rhythm: Normal rate and regular rhythm.     Heart sounds: Normal heart sounds.  Pulmonary:     Effort: Pulmonary effort is normal.     Breath sounds: Normal breath sounds.  Abdominal:     Palpations: Abdomen is soft.  Musculoskeletal:     Comments: 1+ edema.  Skin:    General: Skin is warm and dry.  Neurological:     Mental Status: He is alert and oriented to person, place, and time.  Psychiatric:        Behavior: Behavior normal.        Thought Content: Thought content normal.        Judgment: Judgment normal.         Assessment & Plan    1. Essential hypertension  - CBC with Differential/Platelet - Comprehensive metabolic panel - TSH  2. Dyslipidemia Pt declines treatment of lipids at 85 yo. - Comprehensive metabolic panel  3. Hyperglycemia  - Hemoglobin A1c  4. Gastroesophageal reflux disease without esophagitis Off Zantac--on Pantoprazole.     Richard Cranford Mon, MD  [facility]

## 2019-01-24 LAB — COMPREHENSIVE METABOLIC PANEL
ALT: 20 IU/L (ref 0–44)
AST: 27 IU/L (ref 0–40)
Albumin/Globulin Ratio: 2 (ref 1.2–2.2)
Albumin: 4.5 g/dL (ref 3.5–4.6)
Alkaline Phosphatase: 111 IU/L (ref 39–117)
BUN/Creatinine Ratio: 18 (ref 10–24)
BUN: 22 mg/dL (ref 10–36)
Bilirubin Total: 0.4 mg/dL (ref 0.0–1.2)
CO2: 22 mmol/L (ref 20–29)
Calcium: 9.1 mg/dL (ref 8.6–10.2)
Chloride: 106 mmol/L (ref 96–106)
Creatinine, Ser: 1.24 mg/dL (ref 0.76–1.27)
GFR calc Af Amer: 59 mL/min/{1.73_m2} — ABNORMAL LOW (ref >59)
GFR calc non Af Amer: 51 mL/min/{1.73_m2} — ABNORMAL LOW (ref >59)
Globulin, Total: 2.3 g/dL (ref 1.5–4.5)
Glucose: 101 mg/dL — ABNORMAL HIGH (ref 65–99)
Potassium: 4.7 mmol/L (ref 3.5–5.2)
Sodium: 143 mmol/L (ref 134–144)
Total Protein: 6.8 g/dL (ref 6.0–8.5)

## 2019-01-24 LAB — CBC WITH DIFFERENTIAL/PLATELET
Basophils Absolute: 0.1 10*3/uL (ref 0.0–0.2)
Basos: 1 %
EOS (ABSOLUTE): 0.2 10*3/uL (ref 0.0–0.4)
Eos: 4 %
Hematocrit: 42.9 % (ref 37.5–51.0)
Hemoglobin: 14.3 g/dL (ref 13.0–17.7)
Immature Grans (Abs): 0 10*3/uL (ref 0.0–0.1)
Immature Granulocytes: 0 %
Lymphocytes Absolute: 1.4 10*3/uL (ref 0.7–3.1)
Lymphs: 24 %
MCH: 29.7 pg (ref 26.6–33.0)
MCHC: 33.3 g/dL (ref 31.5–35.7)
MCV: 89 fL (ref 79–97)
Monocytes Absolute: 0.5 10*3/uL (ref 0.1–0.9)
Monocytes: 9 %
Neutrophils Absolute: 3.4 10*3/uL (ref 1.4–7.0)
Neutrophils: 62 %
Platelets: 245 10*3/uL (ref 150–450)
RBC: 4.81 x10E6/uL (ref 4.14–5.80)
RDW: 13 % (ref 11.6–15.4)
WBC: 5.6 10*3/uL (ref 3.4–10.8)

## 2019-01-24 LAB — HEMOGLOBIN A1C
Est. average glucose Bld gHb Est-mCnc: 120 mg/dL
Hgb A1c MFr Bld: 5.8 % — ABNORMAL HIGH (ref 4.8–5.6)

## 2019-01-24 LAB — TSH: TSH: 1.99 u[IU]/mL (ref 0.450–4.500)

## 2019-01-27 ENCOUNTER — Telehealth: Payer: Self-pay

## 2019-01-27 NOTE — Telephone Encounter (Signed)
-----   Message from Jerrol Banana., MD sent at 01/24/2019 11:45 AM EDT ----- Labs stable.

## 2019-01-27 NOTE — Telephone Encounter (Signed)
Patient advised.KW 

## 2019-02-10 DIAGNOSIS — H532 Diplopia: Secondary | ICD-10-CM | POA: Diagnosis not present

## 2019-03-19 ENCOUNTER — Other Ambulatory Visit: Payer: Self-pay | Admitting: Family Medicine

## 2019-03-19 MED ORDER — PANTOPRAZOLE SODIUM 20 MG PO TBEC: 20.0000 mg | DELAYED_RELEASE_TABLET | Freq: Every day | ORAL | 3 refills | Status: DC

## 2019-03-19 NOTE — Telephone Encounter (Signed)
[facility] faxed refill request for the following medications:  pantoprazole (PROTONIX) 20 MG tablet  90 day supply Last Rx: 11/2018 was sent to local pharmacy LOV: 01/23/2019 NOV: 07/28/2019 Please advise. Thanks TNP

## 2019-03-20 ENCOUNTER — Ambulatory Visit: Payer: Medicare Other

## 2019-03-20 ENCOUNTER — Encounter: Payer: Medicare Other | Admitting: Family Medicine

## 2019-03-21 ENCOUNTER — Other Ambulatory Visit: Payer: Self-pay | Admitting: Family Medicine

## 2019-03-21 DIAGNOSIS — I10 Essential (primary) hypertension: Secondary | ICD-10-CM

## 2019-04-23 DIAGNOSIS — R079 Chest pain, unspecified: Secondary | ICD-10-CM | POA: Diagnosis not present

## 2019-04-23 DIAGNOSIS — I10 Essential (primary) hypertension: Secondary | ICD-10-CM | POA: Diagnosis not present

## 2019-04-23 DIAGNOSIS — I4891 Unspecified atrial fibrillation: Secondary | ICD-10-CM | POA: Diagnosis not present

## 2019-04-24 ENCOUNTER — Encounter: Payer: Self-pay | Admitting: Emergency Medicine

## 2019-04-24 ENCOUNTER — Emergency Department
Admission: EM | Admit: 2019-04-24 | Discharge: 2019-04-24 | Discharge status: Against Medical Advice | Payer: Medicare Other | Attending: Emergency Medicine | Admitting: Emergency Medicine

## 2019-04-24 ENCOUNTER — Emergency Department: Payer: Medicare Other

## 2019-04-24 ENCOUNTER — Other Ambulatory Visit: Payer: Self-pay

## 2019-04-24 DIAGNOSIS — I10 Essential (primary) hypertension: Secondary | ICD-10-CM | POA: Insufficient documentation

## 2019-04-24 DIAGNOSIS — Z7982 Long term (current) use of aspirin: Secondary | ICD-10-CM | POA: Diagnosis not present

## 2019-04-24 DIAGNOSIS — Z87891 Personal history of nicotine dependence: Secondary | ICD-10-CM | POA: Diagnosis not present

## 2019-04-24 DIAGNOSIS — Z20828 Contact with and (suspected) exposure to other viral communicable diseases: Secondary | ICD-10-CM | POA: Diagnosis not present

## 2019-04-24 DIAGNOSIS — Z79899 Other long term (current) drug therapy: Secondary | ICD-10-CM | POA: Diagnosis not present

## 2019-04-24 DIAGNOSIS — Z85828 Personal history of other malignant neoplasm of skin: Secondary | ICD-10-CM | POA: Insufficient documentation

## 2019-04-24 DIAGNOSIS — Z85820 Personal history of malignant melanoma of skin: Secondary | ICD-10-CM | POA: Diagnosis not present

## 2019-04-24 DIAGNOSIS — R0789 Other chest pain: Secondary | ICD-10-CM | POA: Diagnosis not present

## 2019-04-24 DIAGNOSIS — R079 Chest pain, unspecified: Secondary | ICD-10-CM | POA: Diagnosis not present

## 2019-04-24 DIAGNOSIS — Z5320 Procedure and treatment not carried out because of patient's decision for unspecified reasons: Secondary | ICD-10-CM | POA: Diagnosis not present

## 2019-04-24 DIAGNOSIS — I208 Other forms of angina pectoris: Secondary | ICD-10-CM | POA: Diagnosis not present

## 2019-04-24 DIAGNOSIS — I214 Non-ST elevation (NSTEMI) myocardial infarction: Secondary | ICD-10-CM | POA: Diagnosis not present

## 2019-04-24 DIAGNOSIS — R9431 Abnormal electrocardiogram [ECG] [EKG]: Secondary | ICD-10-CM | POA: Diagnosis not present

## 2019-04-24 DIAGNOSIS — E782 Mixed hyperlipidemia: Secondary | ICD-10-CM | POA: Diagnosis not present

## 2019-04-24 LAB — CBC WITH DIFFERENTIAL/PLATELET
Abs Immature Granulocytes: 0.02 10*3/uL (ref 0.00–0.07)
Basophils Absolute: 0.1 10*3/uL (ref 0.0–0.1)
Basophils Relative: 1 %
Eosinophils Absolute: 0.2 10*3/uL (ref 0.0–0.5)
Eosinophils Relative: 3 %
HCT: 43.8 % (ref 39.0–52.0)
Hemoglobin: 14.2 g/dL (ref 13.0–17.0)
Immature Granulocytes: 0 %
Lymphocytes Relative: 24 %
Lymphs Abs: 1.9 10*3/uL (ref 0.7–4.0)
MCH: 29.7 pg (ref 26.0–34.0)
MCHC: 32.4 g/dL (ref 30.0–36.0)
MCV: 91.6 fL (ref 80.0–100.0)
Monocytes Absolute: 0.5 10*3/uL (ref 0.1–1.0)
Monocytes Relative: 6 %
Neutro Abs: 5.4 10*3/uL (ref 1.7–7.7)
Neutrophils Relative %: 66 %
Platelets: 250 10*3/uL (ref 150–400)
RBC: 4.78 MIL/uL (ref 4.22–5.81)
RDW: 13.6 % (ref 11.5–15.5)
WBC: 8.1 10*3/uL (ref 4.0–10.5)
nRBC: 0 % (ref 0.0–0.2)

## 2019-04-24 LAB — COMPREHENSIVE METABOLIC PANEL
ALT: 20 U/L (ref 0–44)
AST: 28 U/L (ref 15–41)
Albumin: 4.4 g/dL (ref 3.5–5.0)
Alkaline Phosphatase: 95 U/L (ref 38–126)
Anion gap: 9 (ref 5–15)
BUN: 22 mg/dL (ref 8–23)
CO2: 25 mmol/L (ref 22–32)
Calcium: 8.7 mg/dL — ABNORMAL LOW (ref 8.9–10.3)
Chloride: 106 mmol/L (ref 98–111)
Creatinine, Ser: 0.83 mg/dL (ref 0.61–1.24)
GFR calc Af Amer: >60 mL/min (ref >60)
GFR calc non Af Amer: >60 mL/min (ref >60)
Glucose, Bld: 132 mg/dL — ABNORMAL HIGH (ref 70–99)
Potassium: 4.1 mmol/L (ref 3.5–5.1)
Sodium: 140 mmol/L (ref 135–145)
Total Bilirubin: 0.4 mg/dL (ref 0.3–1.2)
Total Protein: 7.3 g/dL (ref 6.5–8.1)

## 2019-04-24 LAB — URINALYSIS, ROUTINE W REFLEX MICROSCOPIC
Bilirubin Urine: NEGATIVE
Glucose, UA: NEGATIVE mg/dL
Hgb urine dipstick: NEGATIVE
Ketones, ur: 5 mg/dL — AB
Leukocytes,Ua: NEGATIVE
Nitrite: NEGATIVE
Protein, ur: NEGATIVE mg/dL
Specific Gravity, Urine: 1.013 (ref 1.005–1.030)
pH: 7 (ref 5.0–8.0)

## 2019-04-24 LAB — TROPONIN I (HIGH SENSITIVITY)
Troponin I (High Sensitivity): 32 ng/L — ABNORMAL HIGH (ref <18)
Troponin I (High Sensitivity): 109 ng/L (ref <18)

## 2019-04-24 LAB — SARS CORONAVIRUS 2 BY RT PCR (HOSPITAL ORDER, PERFORMED IN ~~LOC~~ HOSPITAL LAB): SARS Coronavirus 2: NEGATIVE

## 2019-04-24 MED ORDER — ASPIRIN EC 81 MG PO TBEC: 81.0000 mg | DELAYED_RELEASE_TABLET | Freq: Every day | ORAL | 2 refills | Status: AC

## 2019-04-24 MED ORDER — METOPROLOL SUCCINATE ER 25 MG PO TB24: 25.0000 mg | ORAL_TABLET | Freq: Every day | ORAL | 11 refills | Status: DC

## 2019-04-24 NOTE — ED Notes (Signed)
Patient insists on leaving AMA

## 2019-04-24 NOTE — ED Notes (Signed)
Date and time results received: 04/24/19 0306   Test: troponin  Critical Value: 109  Name of Provider Notified: Dr. Karma Greaser

## 2019-04-24 NOTE — ED Provider Notes (Signed)
[facility] Emergency Department Provider Note  ____________________________________________   First MD Initiated Contact with Patient 04/24/19 0033     (approximate)  I have reviewed the triage vital signs and the nursing notes.   HISTORY  Chief Complaint Chest Pain    HPI Adam Hanna. is a 85 y.o. male who is remarkably healthy and active for his age.   He has no known coronary artery disease and medical history as listed below.  He presents by EMS for evaluation of chest pain.  He has had episodic chest pain today while at rest.  Exertion does not seem to make it worse.  He describes it as all across the upper part of his chest from the left to the right side and sometimes radiating down to 1 or both of his arms.  The episodes were relatively brief until this evening when the episode lasted longer.  He has some nitroglycerin at home which he took on the 2 prior episodes and it completely relieve the pain.  The third episode did not get better with nitroglycerin and it was more severe.  He denies shortness of breath, diaphoresis, and nausea/vomiting.  At this point he was becoming concerned so he called 911 and he took a full dose aspirin after which time he said that the chest pain did improve.  He still feels that to a very minor extent but it is still present which is atypical for him.  He has seen Dr. Nehemiah Massed in the past and had a stress test a few years ago which she says was reassuring.  He does not take a daily baby aspirin but takes naproxen twice a day.  He is active and generally healthy and has a good quality of life with no history of dementia.     Past Medical History:  Diagnosis Date  . Bone spur   . GERD (gastroesophageal reflux disease)   . Hx of basal cell carcinoma 2007   multiple sites  . Hx of dysplastic nevus 05/28/2002   R-4 medial toe  . Hx of malignant melanoma 1991   L post. auricular. WLE performed in [place]  . Hx of  squamous cell carcinoma of skin 2012   multiple sites  . Hyperlipidemia   . Hypertension     Patient Active Problem List   Diagnosis Date Noted  . Lumbar radiculopathy 01/16/2017  . Chest pain 02/24/2016  . 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 Dyslipidemia 12/25/2014  . 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 Temporary cerebral vascular dysfunction 12/25/2014  . Acid reflux 10/19/2009  . BP (high blood pressure) 06/23/2008    Past Surgical History:  Procedure Laterality Date  . BACK SURGERY    . CATARACT EXTRACTION Bilateral   . CYST REMOVAL NECK    . MELANOMA EXCISION Left 1991   post. auricular    Prior to Admission medications   Medication Sig Start Date End Date Taking? Authorizing Provider  aspirin EC 81 MG tablet Take 1 tablet (81 mg total) by mouth daily. 04/24/19 04/23/20  Hinda Kehr, MD  ezetimibe (ZETIA) 10 MG tablet TAKE 1 TABLET EVERY MORNING Patient not taking: Reported on 01/16/2018 12/20/17   Jerrol Banana., MD  metoprolol succinate (TOPROL XL) 25 MG 24 hr tablet Take 1 tablet (25 mg total) by mouth daily. 04/24/19 04/23/20  Hinda Kehr, MD  naproxen sodium (ALEVE) 220 MG tablet Take 220 mg by  mouth 2 (two) times daily.     [provider]  nitroGLYCERIN (NITROSTAT) 0.4 MG SL tablet Place 0.4 mg under the tongue every 5 (five) minutes as needed.  02/24/16   [provider]  oxybutynin (DITROPAN XL) 10 MG 24 hr tablet Take 1 tablet (10 mg total) by mouth at bedtime. Patient not taking: Reported on 01/16/2018 12/17/17   Jerrol Banana., MD  pantoprazole (PROTONIX) 20 MG tablet Take 1 tablet (20 mg total) by mouth daily. 03/19/19   Jerrol Banana., MD  ranitidine (ZANTAC) 150 MG tablet Take 1 tablet (150 mg total) at bedtime by mouth. 07/02/17   Jerrol Banana., MD  NIFEdipine (PROCARDIA-XL/NIFEDICAL-XL) 30 MG 24 hr tablet TAKE 1 TABLET TWICE A DAY 03/21/19  04/24/19  Jerrol Banana., MD    Allergies Patient has no known allergies.  Family History  Problem Relation Age of Onset  . Congestive Heart Failure Mother   . Dementia Father   . Other Sister        Suicide by gunshot wound    Social History Social History   Tobacco Use  . Smoking status: Former Smoker    Packs/day: 1.00    Years: 20.00    Pack years: 20.00    Types: Cigarettes    Quit date: 08/21/1963    Years since quitting: 55.7  . Smokeless tobacco: Never Used  Substance Use Topics  . Alcohol use: Yes    Alcohol/week: 7.0 - 17.0 standard drinks    Types: 7 Standard drinks or equivalent per week  . Drug use: No    Review of Systems Constitutional: No fever/chills Eyes: No visual changes. ENT: No sore throat. Cardiovascular: +chest pain. Respiratory: Denies shortness of breath. Gastrointestinal: No abdominal pain.  No nausea, no vomiting.  No diarrhea.  No constipation. Genitourinary: Negative for dysuria. Musculoskeletal: Negative for neck pain.  Negative for back pain. Integumentary: Negative for rash. Neurological: Negative for headaches, focal weakness or numbness.   ____________________________________________   PHYSICAL EXAM:  VITAL SIGNS: ED Triage Vitals  Enc Vitals Group     BP 04/24/19 0012 (!) 121/95     Pulse Rate 04/24/19 0012 70     Resp 04/24/19 0012 (!) 21     Temp 04/24/19 0012 98.5 F (36.9 C)     Temp Source 04/24/19 0012 Oral     SpO2 04/24/19 0012 96 %     Weight 04/24/19 0013 93 kg (205 lb)     Height 04/24/19 0013 1.854 m (6\' 1" )     Head Circumference --      Peak Flow --      Pain Score 04/24/19 0012 4     Pain Loc --      Pain Edu? --      Excl. in [place]? --     Constitutional: Alert and oriented.  Appears much younger than chronological age. Eyes: Conjunctivae are normal.  Head: Atraumatic. Nose: No congestion/rhinnorhea. Mouth/Throat: Mucous membranes are moist. Neck: No stridor.  No meningeal signs.    Cardiovascular: Normal rate, regular rhythm. Good peripheral circulation. Grossly normal heart sounds. Respiratory: Normal respiratory effort.  No retractions. Gastrointestinal: Soft and nontender. No distention.  Musculoskeletal: No lower extremity tenderness nor edema. No gross deformities of extremities. Neurologic:  Normal speech and language. No gross focal neurologic deficits are appreciated.  Skin:  Skin is warm, dry and intact. Psychiatric: Mood and affect are normal. Speech and behavior are normal.  ____________________________________________  LABS (all labs ordered are listed, but only abnormal results are displayed)  Labs Reviewed  COMPREHENSIVE METABOLIC PANEL - Abnormal; Notable for the following components:      Result Value   Glucose, Bld 132 (*)    Calcium 8.7 (*)    All other components within normal limits  URINALYSIS, ROUTINE W REFLEX MICROSCOPIC - Abnormal; Notable for the following components:   Color, Urine STRAW (*)    APPearance CLEAR (*)    Ketones, ur 5 (*)    All other components within normal limits  TROPONIN I (HIGH SENSITIVITY) - Abnormal; Notable for the following components:   Troponin I (High Sensitivity) 32 (*)    All other components within normal limits  TROPONIN I (HIGH SENSITIVITY) - Abnormal; Notable for the following components:   Troponin I (High Sensitivity) 109 (*)    All other components within normal limits  SARS CORONAVIRUS 2 (HOSPITAL ORDER, [place] [facility])  CBC WITH DIFFERENTIAL/PLATELET   ____________________________________________  EKG  ED ECG REPORT I, Hinda Kehr, the attending physician, personally viewed and interpreted this ECG.  Date: 04/24/2019 EKG Time: 00:09 Rate: 75 Rhythm: normal sinus rhythm QRS Axis: normal Intervals: normal ST/T Wave abnormalities: Non-specific ST segment / T-wave changes, but no clear evidence of acute ischemia. Narrative Interpretation: no definitive evidence  of acute ischemia; does not meet STEMI criteria.  Some changes from prior.   ____________________________________________  RADIOLOGY Ursula Alert, personally viewed and evaluated these images (plain radiographs) as part of my medical decision making, as well as reviewing the written report by the radiologist.  ED MD interpretation: No indication for emergent imaging.  Official radiology report(s): Dg Chest Portable 1 View  Result Date: 04/24/2019 CLINICAL DATA:  Chest pain EXAM: PORTABLE CHEST 1 VIEW COMPARISON:  Radiograph 02/22/2016 FINDINGS: Diffuse hazy interstitial opacities with peripheral septal lines, vascular cephalization and peribronchovascular cuffing. Some coarsened interstitial changes are similar to comparison studies. The aorta is calcified. Heart is at the upper limits of normal for size but may be accentuated by portable technique. IMPRESSION: Findings suggest developing interstitial edema on a background of chronic interstitial change. Electronically Signed   By: Lovena Le M.D.   On: 04/24/2019 02:06    ____________________________________________   PROCEDURES   Procedure(s) performed (including Critical Care):  Procedures   ____________________________________________   INITIAL IMPRESSION / MDM / [place] / ED COURSE  As part of my medical decision making, I reviewed the following data within the [place] notes reviewed and incorporated, Labs reviewed , EKG interpreted , Old EKG reviewed, Old chart reviewed, Radiograph reviewed , Discussed with cardiologist (Dr. Ubaldo Glassing) and reviewed Notes from prior ED visits   Differential diagnosis includes, but is not limited to, ACS/unstable angina versus NSTEMI, musculoskeletal pain, PE, pneumonia, pneumothorax.  The patient's history is strongly suggestive of ACS, at a minimum unstable angina and possibly NSTEMI based on his troponin.  He has some nonspecific T wave changes that I  believe could reflect ischemia and are unchanged from his prior EKG.  He has already received a full dose of aspirin.  I have initiated a discussion with him about admission to the hospital on heparin but he is a little bit reluctant at this time.  I will discuss it again with him once his troponin results are back.      Clinical Course as of Apr 23 718  Thu Apr 24, 2019  0200 Patient's initial troponin is elevated.  Given the history that is strongly suggestive of ACS and initial elevated troponin, I recommended to him that we start heparin and admit him for unstable angina at a minimum which might possibly develop into NSTEMI.  He declines at this time and says he prefers to go home.  We talked about it and negotiated for a while, and he agrees to stay for a second troponin and then we will discuss the disposition plan again.  He is very reasonable, calm, worried about his wife who is elderly and at home by herself with chronic health issues, and he understands my concerns that he could have a potentially life-threatening MI if left untreated.  We will discuss again.  Troponin I (High Sensitivity)(!): 32 [CF]  0317 Patient's second troponin is 109.  This is consistent with NSTEMI.  I discussed the results with the patient at length including the risk of permanent heart damage, disability, and death.  I tried for an extended period of time to convince the patient to stay in the hospital even if it is just for cardiology evaluation, echocardiogram, and medical management and optimization although I suspect he will benefit from cardiac catheterization/PCI.  He refuses.  He has a capacity to make his own decisions and he understands that his refusal of additional care may result in permanent disability or death.  He knows he can return to the emergency department at anytime if he changes his mind.I have placed a page to Dr. Ubaldo Glassing with [facility]  clinic cardiology so that I can ask for any medication  recommendations to try to offer the patient some care for his ongoing myocardial infarction.  Troponin I (High Sensitivity)(!!): 109 [CF]  [id number] Dr. Ubaldo Glassing recommended that patient stop taking the nifedipine, which is contraindicated in ischemia, and start taking metoprolol succinate 25 mg daily with a morning dose.  He also recommended the patient continue taking a daily baby aspirin.I put all this information in the patient's AVS and explained it to him verbally in person.  He again refuses admission and understands the potential consequences.  As an adult with capacity I cannot call other family members against his wishes and he does not meet criteria for involuntary commitment.  He knows he may return to the ED at any time.  Of note, Dr. Ubaldo Glassing said that he thinks Dr. Nehemiah Massed should be able to see the patient in clinic later today and I encouraged the patient to call the clinic and also sent a message through the computer system to Dr. Nehemiah Massed and Dr. Ubaldo Glassing to try to facilitate follow-up.  Patient signed out AMA.   [CF]    Clinical Course User Index [CF] Hinda Kehr, MD     ____________________________________________  FINAL CLINICAL IMPRESSION(S) / ED DIAGNOSES  Final diagnoses:  NSTEMI (non-ST elevated myocardial infarction) ([place])     MEDICATIONS GIVEN DURING THIS VISIT:  Medications - No data to display   ED Discharge Orders         Ordered    metoprolol succinate (TOPROL XL) 25 MG 24 hr tablet  Daily     04/24/19 0327    aspirin EC 81 MG tablet  Daily     04/24/19 0327          *Please note:  Adam Hanna. was evaluated in Emergency Department on 04/24/2019 for the symptoms described in the history of present illness. He was evaluated in the context of the global COVID-19 pandemic, which necessitated consideration that the patient might be  at risk for infection with the SARS-CoV-2 virus that causes COVID-19. Institutional protocols and algorithms that pertain to the  evaluation of patients at risk for COVID-19 are in a state of rapid change based on information released by regulatory bodies including the CDC and federal and state organizations. These policies and algorithms were followed during the patient's care in the ED.  Some ED evaluations and interventions may be delayed as a result of limited staffing during the pandemic.*  Note:  This document was prepared using Dragon voice recognition software and may include unintentional dictation errors.   Hinda Kehr, MD 04/24/19 [id number]

## 2019-04-24 NOTE — Discharge Instructions (Addendum)
Your workup tonight indicates that you are having a heart attack.  I tried to convince you to stay in the hospital, but you are refusing to do so, and have the capacity to make your own decisions.  Please understand that by leaving and not getting additional care, you run a high risk of permanent disability or death.  You may return to the Emergency Department for additional treatment at any time.  I spoke with the cardiologist on call for Dr. Nehemiah Massed.  He recommend that you stop taking your nifedipine.  Instead, please go to a local pharmacy in the morning and begin taking your prescription for metoprolol succinate 25 mg daily.  You also need to take a daily baby aspirin.  Use the nitroglycerin you have at home according to the label instructions as needed.  I sent Dr. Nehemiah Massed a message through the computer so he knows about your situation.  His office should call you to try and get you an appointment in their clinic today, but you may also call.  Tell them you left the Emergency Department against medical advice and need to see Dr. Nehemiah Massed in clinic today to follow up.  Remember, if you develop any new or worsening symptoms, please call 911 and return to the Emergency Department immediately.

## 2019-04-24 NOTE — ED Notes (Signed)
Patient chose to leave AMA after much discussion with this RN and MD. Patient is actively having a heart attack but does not want to stay. Called [place] taxi for patient, ambulatory at discharge. COVID swab obtained in case patient needs intervention emergently. Patient aware of risks of leaving including death.

## 2019-04-24 NOTE — ED Triage Notes (Signed)
Patient from home via EMS with chest pain that started this afternoon, on and off. Episodes last about 30-45 minutes; patient took 325 ASA and 2 nitro at home, some relief. Total 3 episodes. No chest pain at present

## 2019-04-24 NOTE — ED Notes (Signed)
ED Provider at bedside to discuss admission

## 2019-04-26 DIAGNOSIS — Z23 Encounter for immunization: Secondary | ICD-10-CM | POA: Diagnosis not present

## 2019-04-30 DIAGNOSIS — I208 Other forms of angina pectoris: Secondary | ICD-10-CM | POA: Diagnosis not present

## 2019-05-12 DIAGNOSIS — I208 Other forms of angina pectoris: Secondary | ICD-10-CM | POA: Diagnosis not present

## 2019-05-13 DIAGNOSIS — Z9289 Personal history of other medical treatment: Secondary | ICD-10-CM

## 2019-05-13 DIAGNOSIS — I38 Endocarditis, valve unspecified: Secondary | ICD-10-CM

## 2019-05-13 HISTORY — DX: Personal history of other medical treatment: Z92.89

## 2019-05-13 HISTORY — DX: Endocarditis, valve unspecified: I38

## 2019-05-15 DIAGNOSIS — E782 Mixed hyperlipidemia: Secondary | ICD-10-CM | POA: Diagnosis not present

## 2019-05-15 DIAGNOSIS — R0789 Other chest pain: Secondary | ICD-10-CM | POA: Diagnosis not present

## 2019-05-15 DIAGNOSIS — I10 Essential (primary) hypertension: Secondary | ICD-10-CM | POA: Diagnosis not present

## 2019-06-12 DIAGNOSIS — I208 Other forms of angina pectoris: Secondary | ICD-10-CM | POA: Diagnosis not present

## 2019-06-12 DIAGNOSIS — E782 Mixed hyperlipidemia: Secondary | ICD-10-CM | POA: Diagnosis not present

## 2019-06-12 DIAGNOSIS — I10 Essential (primary) hypertension: Secondary | ICD-10-CM | POA: Diagnosis not present

## 2019-07-15 ENCOUNTER — Other Ambulatory Visit: Payer: Self-pay

## 2019-07-28 ENCOUNTER — Ambulatory Visit: Payer: Self-pay | Admitting: Family Medicine

## 2019-08-20 ENCOUNTER — Telehealth: Payer: Self-pay | Admitting: Family Medicine

## 2019-08-20 NOTE — Telephone Encounter (Signed)
From [facility] 

## 2019-08-20 NOTE — Telephone Encounter (Signed)
Patient called and would like to speak with Dr. Rosanna Randy or his CMA about his left leg pain and seeing if he can be referral somewhere. Please call patient back, thanks.

## 2019-08-20 NOTE — Telephone Encounter (Signed)
Patient states he has been having left leg weakness since yesterday. Patient has had 3 episodes were he is walking and his left leg "gives out on him". Patient states this will make him fall and have to catch himself. Patient also states he has been having left arm weakness. Patient states he is not having any pain in his leg or arm. No dizziness, no sob and no headache. Per Dr. Rosanna Randy, will schedule patient to come into office at 8:00 am in the morning. Advised patient if symptoms worsen he needs to go to ER. Patient expressed understanding.

## 2019-08-21 ENCOUNTER — Other Ambulatory Visit: Payer: Self-pay

## 2019-08-21 ENCOUNTER — Encounter: Payer: Self-pay | Admitting: Family Medicine

## 2019-08-21 ENCOUNTER — Ambulatory Visit (INDEPENDENT_AMBULATORY_CARE_PROVIDER_SITE_OTHER): Payer: Medicare Other | Admitting: Family Medicine

## 2019-08-21 VITALS — BP 159/81 | HR 64 | Temp 96.9°F | Resp 18 | Ht 73.0 in | Wt 208.0 lb

## 2019-08-21 DIAGNOSIS — R29898 Other symptoms and signs involving the musculoskeletal system: Secondary | ICD-10-CM | POA: Diagnosis not present

## 2019-08-21 DIAGNOSIS — I10 Essential (primary) hypertension: Secondary | ICD-10-CM

## 2019-08-21 DIAGNOSIS — M5416 Radiculopathy, lumbar region: Secondary | ICD-10-CM | POA: Diagnosis not present

## 2019-08-21 DIAGNOSIS — R42 Dizziness and giddiness: Secondary | ICD-10-CM | POA: Diagnosis not present

## 2019-08-21 DIAGNOSIS — E785 Hyperlipidemia, unspecified: Secondary | ICD-10-CM

## 2019-08-21 MED ORDER — MECLIZINE HCL 25 MG PO TABS: 25.0000 mg | ORAL_TABLET | Freq: Three times a day (TID) | ORAL | 3 refills | Status: DC | PRN

## 2019-08-21 MED ORDER — NIFEDIPINE ER OSMOTIC RELEASE 60 MG PO TB24: 60.0000 mg | ORAL_TABLET | Freq: Every day | ORAL | 3 refills | Status: DC

## 2019-08-21 NOTE — Patient Instructions (Addendum)
Take Procardia twice a day until new mail order prescription arrives. Follow up in one month.

## 2019-08-21 NOTE — Progress Notes (Signed)
Patient: Adam Hanna. Male    DOB: 1925-09-15   85 y.o.   MRN: [id number] Visit Date: 08/21/2019  Today's Provider: Wilhemena Durie, MD   Chief Complaint  Patient presents with  . Extremity Weakness   Subjective:     HPI   Patient states he has been having left leg weakness since yesterday. Patient has had 3 episodes were he is walking and his left leg "gives out on him". Patient states this will make him fall and have to catch himself. Patient also states he has been having left arm weakness. Patient states he is not having any pain in his leg or arm. No dizziness, no sob and no headache. He relates his weakness to his ongoing spinal issue. I made a house call 2 days ago when he sotod up and had some dizziness. He has some nystagmus but otherwise a nonfocal neuro exam but his BP was 210/105 but he had not taken his BP meds yet. Pt still lives alone with his 65 yo wife who is in failing health.   No Known Allergies   Current Outpatient Medications:  .  aspirin EC 81 MG tablet, Take 1 tablet (81 mg total) by mouth daily., Disp: 150 tablet, Rfl: 2 .  isosorbide mononitrate (IMDUR) 30 MG 24 hr tablet, Take 30 mg by mouth daily., Disp: , Rfl:  .  metoprolol succinate (TOPROL XL) 25 MG 24 hr tablet, Take 1 tablet (25 mg total) by mouth daily., Disp: 30 tablet, Rfl: 11 .  naproxen sodium (ALEVE) 220 MG tablet, Take 220 mg by mouth 2 (two) times daily. , Disp: , Rfl:  .  NIFEdipine (PROCARDIA-XL/NIFEDICAL-XL) 30 MG 24 hr tablet, nifedipine ER 30 mg tablet,extended release 24 hr, Disp: , Rfl:  .  nitroGLYCERIN (NITROSTAT) 0.4 MG SL tablet, Place 0.4 mg under the tongue every 5 (five) minutes as needed. , Disp: , Rfl:  .  pantoprazole (PROTONIX) 20 MG tablet, Take 1 tablet (20 mg total) by mouth daily., Disp: 90 tablet, Rfl: 3 .  ezetimibe (ZETIA) 10 MG tablet, TAKE 1 TABLET EVERY MORNING (Patient not taking: Reported on 01/16/2018), Disp: 90 tablet, Rfl: 3 .  oxybutynin  (DITROPAN XL) 10 MG 24 hr tablet, Take 1 tablet (10 mg total) by mouth at bedtime. (Patient not taking: Reported on 01/16/2018), Disp: 90 tablet, Rfl: 3 .  ranitidine (ZANTAC) 150 MG tablet, Take 1 tablet (150 mg total) at bedtime by mouth. (Patient not taking: Reported on 08/21/2019), Disp: 90 tablet, Rfl: 3  Review of Systems  Constitutional: Negative for appetite change, chills and fever.  Eyes: Negative.   Respiratory: Negative for chest tightness, shortness of breath and wheezing.   Cardiovascular: Negative for chest pain and palpitations.  Gastrointestinal: Negative for abdominal pain, nausea and vomiting.  Neurological:       Grossly nonfocal.  Psychiatric/Behavioral: Negative.     Social History   Tobacco Use  . Smoking status: Former Smoker    Packs/day: 1.00    Years: 20.00    Pack years: 20.00    Types: Cigarettes    Quit date: 08/21/1963    Years since quitting: 56.0  . Smokeless tobacco: Never Used  Substance Use Topics  . Alcohol use: Yes    Alcohol/week: 7.0 - 17.0 standard drinks    Types: 7 Standard drinks or equivalent per week      Objective:   BP (!) 159/81 (BP Location: Left Arm, Cuff Size: Large)  Pulse 64   Temp (!) 96.9 F (36.1 C) (Other (Comment))   Resp 18   Ht 6\' 1"  (1.854 m)   Wt 208 lb (94.3 kg)   SpO2 95%   BMI 27.44 kg/m  Vitals:   08/21/19 0817 08/21/19 0850  BP: (!) 152/77 (!) 159/81  Pulse: 64 64  Resp: 18   Temp: (!) 96.9 F (36.1 C)   TempSrc: Other (Comment)   SpO2: 95%   Weight: 208 lb (94.3 kg)   Height: 6\' 1"  (1.854 m)   Body mass index is 27.44 kg/m.   Physical Exam Vitals reviewed.  Constitutional:      Appearance: He is well-developed.  HENT:     Head: Normocephalic and atraumatic.  Eyes:     General: No scleral icterus.    Conjunctiva/sclera: Conjunctivae normal.  Neck:     Thyroid: No thyromegaly.  Cardiovascular:     Rate and Rhythm: Normal rate and regular rhythm.     Heart sounds: Normal heart  sounds.  Pulmonary:     Effort: Pulmonary effort is normal.     Breath sounds: Normal breath sounds.  Abdominal:     Palpations: Abdomen is soft.  Musculoskeletal:     Cervical back: Neck supple.     Comments: Trace edema.  Skin:    General: Skin is warm and dry.  Neurological:     General: No focal deficit present.     Mental Status: He is alert and oriented to person, place, and time.     Comments: Nonfocal exam.  Psychiatric:        Mood and Affect: Mood normal.        Behavior: Behavior normal.        Thought Content: Thought content normal.        Judgment: Judgment normal.   ECG reveals NSR with PACs and LVH.   No results found for any visits on 08/21/19.     Assessment & Plan    1. Weakness of left lower extremity Possible spinal stenosis. - EKG 12-Lead - AMB referral to orthopedics  2. Essential hypertension Increased Procardia to 60 mg qd.   - isosorbide mononitrate (IMDUR) 30 MG 24 hr tablet; Take 30 mg by mouth daily. - NIFEdipine (PROCARDIA XL/NIFEDICAL XL) 60 MG 24 hr tablet; Take 1 tablet (60 mg total) by mouth daily.  Dispense: 90 tablet; Refill: 3  3. Dizziness Try meclizine 25 mg tid prn for vertigo symptoms.  - meclizine (ANTIVERT) 25 MG tablet; Take 1 tablet (25 mg total) by mouth 3 (three) times daily as needed for dizziness.  Dispense: 30 tablet; Refill: 3  4. Lumbar radiculopathy More than 50% of 30 minute visit spent in counseling or coordination of care  - AMB referral to orthopedics   Follow up in one month.    I,Delphin Funes,acting as a scribe for Wilhemena Durie, MD.,have documented all relevant documentation on the behalf of Wilhemena Durie, MD,as directed by  Wilhemena Durie, MD while in the presence of Wilhemena Durie, MD.      Wilhemena Durie, MD  [facility]

## 2019-09-08 DIAGNOSIS — M25569 Pain in unspecified knee: Secondary | ICD-10-CM | POA: Diagnosis not present

## 2019-09-08 DIAGNOSIS — M6281 Muscle weakness (generalized): Secondary | ICD-10-CM | POA: Diagnosis not present

## 2019-09-08 DIAGNOSIS — M25561 Pain in right knee: Secondary | ICD-10-CM | POA: Diagnosis not present

## 2019-09-08 DIAGNOSIS — M545 Low back pain: Secondary | ICD-10-CM | POA: Diagnosis not present

## 2019-09-08 DIAGNOSIS — M5416 Radiculopathy, lumbar region: Secondary | ICD-10-CM | POA: Diagnosis not present

## 2019-09-11 ENCOUNTER — Other Ambulatory Visit: Payer: Self-pay

## 2019-09-11 ENCOUNTER — Ambulatory Visit
Admission: RE | Admit: 2019-09-11 | Discharge: 2019-09-11 | Disposition: A | Discharge status: Home / Self Care | Payer: Medicare Other | Source: Ambulatory Visit | Attending: Physical Medicine and Rehabilitation | Admitting: Physical Medicine and Rehabilitation

## 2019-09-11 DIAGNOSIS — M5126 Other intervertebral disc displacement, lumbar region: Secondary | ICD-10-CM | POA: Diagnosis not present

## 2019-09-11 DIAGNOSIS — M5416 Radiculopathy, lumbar region: Secondary | ICD-10-CM | POA: Insufficient documentation

## 2019-09-11 DIAGNOSIS — M6281 Muscle weakness (generalized): Secondary | ICD-10-CM | POA: Diagnosis not present

## 2019-09-11 DIAGNOSIS — M48061 Spinal stenosis, lumbar region without neurogenic claudication: Secondary | ICD-10-CM | POA: Diagnosis not present

## 2019-09-19 NOTE — Progress Notes (Signed)
Patient: Adam Hanna. Male    DOB: [birth year]   86 y.o.   MRN: [id number] Visit Date: 09/22/2019  Today's Provider: Wilhemena Durie, MD   Chief Complaint  Patient presents with  . Dizziness  . Hypertension  . Extremity Weakness   Subjective:     HPI  Leg weakness is better.  Still having some back pain.  CT of the back reveals generative disc mainly on the left.,  Some spinal stenosis.  Facet arthropathy.  Also noted is a 3.7 cm abdominal aortic aneurysm. Weakness of left lower extremity From 08/21/2019-Possible spinal stenosis. Referred to orthopedics.  Essential hypertension From 08/21/2019-Increased Procardia to 60 mg qd.  Dizziness From 08/21/2019-Try meclizine 25 mg tid prn for vertigo symptoms.  Patient says that he has been taking Aleve for 15 years and wants to know if its safe for him to continue taking it.   No Known Allergies   Current Outpatient Medications:  .  aspirin EC 81 MG tablet, Take 1 tablet (81 mg total) by mouth daily., Disp: 150 tablet, Rfl: 2 .  ezetimibe (ZETIA) 10 MG tablet, TAKE 1 TABLET EVERY MORNING (Patient not taking: Reported on 01/16/2018), Disp: 90 tablet, Rfl: 3 .  isosorbide mononitrate (IMDUR) 30 MG 24 hr tablet, Take 30 mg by mouth daily., Disp: , Rfl:  .  meclizine (ANTIVERT) 25 MG tablet, Take 1 tablet (25 mg total) by mouth 3 (three) times daily as needed for dizziness., Disp: 30 tablet, Rfl: 3 .  metoprolol succinate (TOPROL XL) 25 MG 24 hr tablet, Take 1 tablet (25 mg total) by mouth daily., Disp: 30 tablet, Rfl: 11 .  naproxen sodium (ALEVE) 220 MG tablet, Take 220 mg by mouth 2 (two) times daily. , Disp: , Rfl:  .  NIFEdipine (PROCARDIA XL/NIFEDICAL XL) 60 MG 24 hr tablet, Take 1 tablet (60 mg total) by mouth daily., Disp: 90 tablet, Rfl: 3 .  nitroGLYCERIN (NITROSTAT) 0.4 MG SL tablet, Place 0.4 mg under the tongue every 5 (five) minutes as needed. , Disp: , Rfl:  .  oxybutynin (DITROPAN XL) 10 MG 24 hr tablet,  Take 1 tablet (10 mg total) by mouth at bedtime. (Patient not taking: Reported on 01/16/2018), Disp: 90 tablet, Rfl: 3 .  pantoprazole (PROTONIX) 20 MG tablet, Take 1 tablet (20 mg total) by mouth daily., Disp: 90 tablet, Rfl: 3 .  ranitidine (ZANTAC) 150 MG tablet, Take 1 tablet (150 mg total) at bedtime by mouth. (Patient not taking: Reported on 08/21/2019), Disp: 90 tablet, Rfl: 3  Review of Systems  Constitutional: Negative for appetite change, chills and fever.  HENT: Negative.   Eyes: Negative.   Respiratory: Negative for chest tightness, shortness of breath and wheezing.   Cardiovascular: Negative for chest pain and palpitations.  Gastrointestinal: Negative for abdominal pain, nausea and vomiting.  Endocrine: Negative.   Genitourinary: Positive for decreased urine volume.  Musculoskeletal: Positive for back pain.  Allergic/Immunologic: Negative.   Hematological: Negative.   Psychiatric/Behavioral: Negative.     Social History   Tobacco Use  . Smoking status: Former Smoker    Packs/day: 1.00    Years: 20.00    Pack years: 20.00    Types: Cigarettes    Quit date: 08/21/1963    Years since quitting: 56.1  . Smokeless tobacco: Never Used  Substance Use Topics  . Alcohol use: Yes    Alcohol/week: 7.0 - 17.0 standard drinks    Types: 7 Standard drinks or equivalent  per week      Objective:   There were no vitals taken for this visit. There were no vitals filed for this visit.There is no height or weight on file to calculate BMI.   Physical Exam Vitals reviewed.  Constitutional:      Appearance: He is well-developed.  HENT:     Head: Normocephalic and atraumatic.     Right Ear: External ear normal.     Left Ear: External ear normal.     Nose: Nose normal.  Eyes:     General: No scleral icterus.    Conjunctiva/sclera: Conjunctivae normal.  Neck:     Thyroid: No thyromegaly.  Cardiovascular:     Rate and Rhythm: Normal rate and regular rhythm.     Heart sounds:  Normal heart sounds.  Pulmonary:     Effort: Pulmonary effort is normal.     Breath sounds: Normal breath sounds.  Abdominal:     Palpations: Abdomen is soft.  Musculoskeletal:     Cervical back: Neck supple.     Comments: 1+ edema.  Skin:    General: Skin is warm and dry.  Neurological:     Mental Status: He is alert and oriented to person, place, and time.  Psychiatric:        Behavior: Behavior normal.        Thought Content: Thought content normal.        Judgment: Judgment normal.      No results found for any visits on 09/22/19.     Assessment & Plan     1. Essential hypertension On metoprolol and nifedipine.  Return to clinic 2 to 3 months.  His blood pressures at home are running better than here in the office today. - CBC with Differential/Platelet - Comprehensive metabolic panel - Lipid panel - TSH  2. Dyslipidemia On Zetia.  Aten due to myalgias - CBC with Differential/Platelet - Comprehensive metabolic panel - Lipid panel - TSH  3. Congestive heart failure, unspecified HF chronicity, unspecified heart failure type ([place])  - CBC with Differential/Platelet - Comprehensive metabolic panel - Lipid panel - TSH  4. Abdominal aortic aneurysm (AAA) without rupture ([place]) Follow with yearly ultrasound at patient request. - CBC with Differential/Platelet - Comprehensive metabolic panel - Lipid panel - TSH  5. Stable angina ([place]) All risk factors treated  6. Lumbar radiculopathy States he is taking Aleve twice a day.  Cautioned against the potential side effects.   Trena Platt Prescious Hurless,acting as a scribe for Wilhemena Durie, MD.,have documented all relevant documentation on the behalf of Wilhemena Durie, MD,as directed by  Wilhemena Durie, MD while in the presence of Wilhemena Durie, MD.     Wilhemena Durie, MD  [facility]

## 2019-09-22 ENCOUNTER — Ambulatory Visit (INDEPENDENT_AMBULATORY_CARE_PROVIDER_SITE_OTHER): Payer: Medicare Other | Admitting: Family Medicine

## 2019-09-22 ENCOUNTER — Other Ambulatory Visit: Payer: Self-pay

## 2019-09-22 ENCOUNTER — Encounter: Payer: Self-pay | Admitting: Family Medicine

## 2019-09-22 VITALS — BP 185/74 | HR 57 | Temp 95.1°F | Wt 210.8 lb

## 2019-09-22 DIAGNOSIS — I714 Abdominal aortic aneurysm, without rupture, unspecified: Secondary | ICD-10-CM

## 2019-09-22 DIAGNOSIS — E785 Hyperlipidemia, unspecified: Secondary | ICD-10-CM

## 2019-09-22 DIAGNOSIS — I208 Other forms of angina pectoris: Secondary | ICD-10-CM

## 2019-09-22 DIAGNOSIS — I509 Heart failure, unspecified: Secondary | ICD-10-CM | POA: Diagnosis not present

## 2019-09-22 DIAGNOSIS — I10 Essential (primary) hypertension: Secondary | ICD-10-CM | POA: Diagnosis not present

## 2019-09-22 DIAGNOSIS — M5416 Radiculopathy, lumbar region: Secondary | ICD-10-CM | POA: Diagnosis not present

## 2019-09-23 LAB — CBC WITH DIFFERENTIAL/PLATELET
Basophils Absolute: 0.1 10*3/uL (ref 0.0–0.2)
Basos: 1 %
EOS (ABSOLUTE): 0.3 10*3/uL (ref 0.0–0.4)
Eos: 5 %
Hematocrit: 44.2 % (ref 37.5–51.0)
Hemoglobin: 15.1 g/dL (ref 13.0–17.7)
Immature Grans (Abs): 0 10*3/uL (ref 0.0–0.1)
Immature Granulocytes: 0 %
Lymphocytes Absolute: 1.6 10*3/uL (ref 0.7–3.1)
Lymphs: 25 %
MCH: 30.2 pg (ref 26.6–33.0)
MCHC: 34.2 g/dL (ref 31.5–35.7)
MCV: 88 fL (ref 79–97)
Monocytes Absolute: 0.5 10*3/uL (ref 0.1–0.9)
Monocytes: 8 %
Neutrophils Absolute: 3.9 10*3/uL (ref 1.4–7.0)
Neutrophils: 61 %
Platelets: 241 10*3/uL (ref 150–450)
RBC: 5 x10E6/uL (ref 4.14–5.80)
RDW: 13.5 % (ref 11.6–15.4)
WBC: 6.5 10*3/uL (ref 3.4–10.8)

## 2019-09-23 LAB — COMPREHENSIVE METABOLIC PANEL
ALT: 21 IU/L (ref 0–44)
AST: 25 IU/L (ref 0–40)
Albumin/Globulin Ratio: 1.7 (ref 1.2–2.2)
Albumin: 4.4 g/dL (ref 3.5–4.6)
Alkaline Phosphatase: 136 IU/L — ABNORMAL HIGH (ref 39–117)
BUN/Creatinine Ratio: 15 (ref 10–24)
BUN: 17 mg/dL (ref 10–36)
Bilirubin Total: 0.7 mg/dL (ref 0.0–1.2)
CO2: 25 mmol/L (ref 20–29)
Calcium: 9.4 mg/dL (ref 8.6–10.2)
Chloride: 102 mmol/L (ref 96–106)
Creatinine, Ser: 1.13 mg/dL (ref 0.76–1.27)
GFR calc Af Amer: 65 mL/min/{1.73_m2} (ref >59)
GFR calc non Af Amer: 57 mL/min/{1.73_m2} — ABNORMAL LOW (ref >59)
Globulin, Total: 2.6 g/dL (ref 1.5–4.5)
Glucose: 97 mg/dL (ref 65–99)
Potassium: 4.9 mmol/L (ref 3.5–5.2)
Sodium: 142 mmol/L (ref 134–144)
Total Protein: 7 g/dL (ref 6.0–8.5)

## 2019-09-23 LAB — LIPID PANEL
Chol/HDL Ratio: 5.6 ratio — ABNORMAL HIGH (ref 0.0–5.0)
Cholesterol, Total: 247 mg/dL — ABNORMAL HIGH (ref 100–199)
HDL: 44 mg/dL (ref >39)
LDL Chol Calc (NIH): 176 mg/dL — ABNORMAL HIGH (ref 0–99)
Triglycerides: 146 mg/dL (ref 0–149)
VLDL Cholesterol Cal: 27 mg/dL (ref 5–40)

## 2019-09-23 LAB — TSH: TSH: 2.18 u[IU]/mL (ref 0.450–4.500)

## 2019-09-29 NOTE — Progress Notes (Signed)
Subjective:   Adam Hanna. is a 86 y.o. male who presents for Medicare Annual/Subsequent preventive examination.    This visit is being conducted through telemedicine due to the COVID-19 pandemic. This patient has given me verbal consent via doximity to conduct this visit, patient states they are participating from their home address. Some vital signs may be absent or patient reported.    Patient identification: identified by name, DOB, and current address  Review of Systems:  N/A  Cardiac Risk Factors include: advanced age (>55men, >32 women);male gender;hypertension;sedentary lifestyle     Objective:    Vitals: There were no vitals taken for this visit.  There is no height or weight on file to calculate BMI. Unable to obtain vitals due to visit being conducted via telephonically.   Advanced Directives 09/30/2019 04/24/2019 03/18/2018 03/16/2017 02/22/2016 02/23/2015  Does Patient Have a Medical Advance Directive? Yes No Yes Yes Yes Yes  Type of Paramedic of [place];Living will - [place];Living will [place];Living will [place];Living will -  Does patient want to make changes to medical advance directive? - - - - No - Patient declined -  Copy of [place] in Chart? Yes - validated most recent copy scanned in chart (See row information) - Yes Yes No - copy requested -  Would patient like information on creating a medical advance directive? - No - Patient declined - - - -    Tobacco Social History   Tobacco Use  Smoking Status Former Smoker  . Packs/day: 1.00  . Years: 20.00  . Pack years: 20.00  . Types: Cigarettes  . Quit date: 08/21/1963  . Years since quitting: 56.1  Smokeless Tobacco Never Used     Counseling given: Not Answered   Clinical Intake:  Pre-visit preparation completed: Yes  Pain : No/denies pain Pain Score: 0-No pain     Nutritional Risks:  None Diabetes: No  How often do you need to have someone help you when you read instructions, pamphlets, or other written materials from your doctor or pharmacy?: 1 - Never  Interpreter Needed?: No  Information entered by :: [facility], LPN  Past Medical History:  Diagnosis Date  . Bone spur   . GERD (gastroesophageal reflux disease)   . Hx of basal cell carcinoma 2007   multiple sites  . Hx of dysplastic nevus 05/28/2002   R-4 medial toe  . Hx of malignant melanoma 1991   L post. auricular. WLE performed in [place]  . Hx of squamous cell carcinoma of skin 2012   multiple sites  . Hyperlipidemia   . Hypertension    Past Surgical History:  Procedure Laterality Date  . BACK SURGERY    . CATARACT EXTRACTION Bilateral   . CYST REMOVAL NECK    . MELANOMA EXCISION Left 1991   post. auricular   Family History  Problem Relation Age of Onset  . Congestive Heart Failure Mother   . Dementia Father   . Other Sister        Suicide by gunshot wound   Social History   Socioeconomic History  . Marital status: Married    Spouse name: Not on file  . Number of children: 2  . Years of education: Not on file  . Highest education level: Bachelor's degree (e.g., BA, AB, BS)  Occupational History  . Occupation: retired  Tobacco Use  . Smoking status: Former Smoker    Packs/day:  1.00    Years: 20.00    Pack years: 20.00    Types: Cigarettes    Quit date: 08/21/1963    Years since quitting: 56.1  . Smokeless tobacco: Never Used  Substance and Sexual Activity  . Alcohol use: Yes    Alcohol/week: 0.0 - 12.0 standard drinks    Comment: 1 beer , wine or scotch before dinner  . Drug use: No  . Sexual activity: Not on file  Other Topics Concern  . Not on file  Social History Narrative  . Not on file   Social Determinants of Health   Financial Resource Strain: Low Risk   . Difficulty of Paying Living Expenses: Not hard at all  Food Insecurity: No Food Insecurity  . Worried About  Charity fundraiser in the Last Year: Never true  . Ran Out of Food in the Last Year: Never true  Transportation Needs: No Transportation Needs  . Lack of Transportation (Medical): No  . Lack of Transportation (Non-Medical): No  Physical Activity: Insufficiently Active  . Days of Exercise per Week: 5 days  . Minutes of Exercise per Session: 10 min  Stress: No Stress Concern Present  . Feeling of Stress : Not at all  Social Connections: Somewhat Isolated  . Frequency of Communication with Friends and Family: More than three times a week  . Frequency of Social Gatherings with Friends and Family: More than three times a week  . Attends Religious Services: Never  . Active Member of Clubs or Organizations: No  . Attends Archivist Meetings: Never  . Marital Status: Married    Outpatient Encounter Medications as of 09/30/2019  Medication Sig  . aspirin EC 81 MG tablet Take 1 tablet (81 mg total) by mouth daily.  . isosorbide mononitrate (IMDUR) 30 MG 24 hr tablet Take 30 mg by mouth daily.  . meclizine (ANTIVERT) 25 MG tablet Take 1 tablet (25 mg total) by mouth 3 (three) times daily as needed for dizziness.  . metoprolol succinate (TOPROL XL) 25 MG 24 hr tablet Take 1 tablet (25 mg total) by mouth daily. (Patient taking differently: Take 25 mg by mouth 2 (two) times daily. )  . nitroGLYCERIN (NITROSTAT) 0.4 MG SL tablet Place 0.4 mg under the tongue every 5 (five) minutes as needed.   . pantoprazole (PROTONIX) 20 MG tablet Take 1 tablet (20 mg total) by mouth daily.  Marland Kitchen ezetimibe (ZETIA) 10 MG tablet TAKE 1 TABLET EVERY MORNING (Patient not taking: Reported on 01/16/2018)  . naproxen sodium (ALEVE) 220 MG tablet Take 220 mg by mouth 2 (two) times daily.   Marland Kitchen NIFEdipine (PROCARDIA XL/NIFEDICAL XL) 60 MG 24 hr tablet Take 1 tablet (60 mg total) by mouth daily. (Patient not taking: Reported on 09/22/2019)  . oxybutynin (DITROPAN XL) 10 MG 24 hr tablet Take 1 tablet (10 mg total) by mouth at  bedtime. (Patient not taking: Reported on 01/16/2018)  . ranitidine (ZANTAC) 150 MG tablet Take 1 tablet (150 mg total) at bedtime by mouth. (Patient not taking: Reported on 08/21/2019)   No facility-administered encounter medications on file as of 09/30/2019.    Activities of Daily Living In your present state of health, do you have any difficulty performing the following activities: 09/30/2019  Hearing? Y  Comment Has trouble hearing high frequency noises.  Vision? N  Difficulty concentrating or making decisions? Y  Walking or climbing stairs? N  Dressing or bathing? N  Doing errands, shopping? N  Preparing Food  and eating ? N  Using the Toilet? N  In the past six months, have you accidently leaked urine? N  Do you have problems with loss of bowel control? N  Managing your Medications? N  Managing your Finances? N  Housekeeping or managing your Housekeeping? N  Some recent data might be hidden    Patient Care Team: Jerrol Banana., MD as PCP - General (Family Medicine) Ralene Bathe, MD as Consulting Physician (Dermatology) Corey Skains, MD as Consulting Physician (Cardiology) Nadene Rubins, DO as Referring Physician (Physical Medicine and Rehabilitation) Leandrew Koyanagi, MD as Referring Physician (Ophthalmology)   Assessment:   This is a routine wellness examination for Jaylen.  Exercise Activities and Dietary recommendations Current Exercise Habits: Home exercise routine, Type of exercise: walking, Time (Minutes): 15, Frequency (Times/Week): 5, Weekly Exercise (Minutes/Week): 75, Intensity: Mild, Exercise limited by: None identified  Goals    . Increase water intake     Recommend increasing water intake to 6 glasses of water a day.        Fall Risk: Fall Risk  09/30/2019 07/15/2019 03/18/2018 03/16/2017 03/15/2016  Falls in the past year? 0 0 No No No  Comment - Emmi Telephone Survey: data to providers prior to load - - -  Number falls in past yr: 0  - - - -  Injury with Fall? 0 - - - -    FALL RISK PREVENTION PERTAINING TO THE HOME:  Any stairs in or around the home? Yes  If so, are there any without handrails? No   Home free of loose throw rugs in walkways, pet beds, electrical cords, etc? Yes  Adequate lighting in your home to reduce risk of falls? Yes   ASSISTIVE DEVICES UTILIZED TO PREVENT FALLS:  Life alert? No  Use of a cane, walker or w/c? No  Grab bars in the bathroom? Yes  Shower chair or bench in shower? No  Elevated toilet seat or a handicapped toilet? No   TIMED UP AND GO:  Was the test performed? No .    Depression Screen PHQ 2/9 Scores 09/30/2019 03/18/2018 03/16/2017 03/15/2016  PHQ - 2 Score 0 0 0 0    Cognitive Function: Declined today.     6CIT Screen 03/18/2018 03/16/2017  What Year? 0 points 0 points  What month? 0 points 0 points  What time? 0 points 0 points  Count back from 20 0 points 0 points  Months in reverse 0 points 0 points  Repeat phrase 0 points 4 points  Total Score 0 4    Immunization History  Administered Date(s) Administered  . Influenza, High Dose Seasonal PF 06/18/2018, 04/26/2019  . Pneumococcal Conjugate-13 02/04/2014  . Pneumococcal Polysaccharide-23 06/23/2005  . Tdap 07/31/2013  . Zoster 06/19/2006    Qualifies for Shingles Vaccine? Yes  Zostavax completed 06/19/06. Due for Shingrix. Pt has been advised to call insurance company to determine out of pocket expense. Advised may also receive vaccine at local pharmacy or Health Dept. Verbalized acceptance and understanding.  Tdap: Up to date  Flu Vaccine: Up to date  Pneumococcal Vaccine: Completed series  Screening Tests Health Maintenance  Topic Date Due  . TETANUS/TDAP  08/01/2023  . INFLUENZA VACCINE  Completed  . PNA vac Low Risk Adult  Completed   Cancer Screenings:  Colorectal Screening: No longer required.   Lung Cancer Screening: (Low Dose CT Chest recommended if Age 21-80 years, 30 pack-year  currently smoking OR have quit w/in 15years.)  does not qualify.   Additional Screening:  Vision Screening: Recommended annual ophthalmology exams for early detection of glaucoma and other disorders of the eye.  Dental Screening: Recommended annual dental exams for proper oral hygiene  Community Resource Referral:  CRR required this visit?  No        Plan:  I have personally reviewed and addressed the Medicare Annual Wellness questionnaire and have noted the following in the patient's chart:  A. Medical and social history B. Use of alcohol, tobacco or illicit drugs  C. Current medications and supplements D. Functional ability and status E.  Nutritional status F.  Physical activity G. Advance directives H. List of other physicians I.  Hospitalizations, surgeries, and ER visits in previous 12 months J.  [place] such as hearing and vision if needed, cognitive and depression L. Referrals and appointments   In addition, I have reviewed and discussed with patient certain preventive protocols, quality metrics, and best practice recommendations. A written personalized care plan for preventive services as well as general preventive health recommendations were provided to patient.   Glendora Score, LPN  [id number] Nurse Health Advisor   Nurse Notes: None.

## 2019-09-30 ENCOUNTER — Ambulatory Visit (INDEPENDENT_AMBULATORY_CARE_PROVIDER_SITE_OTHER): Payer: Medicare Other

## 2019-09-30 ENCOUNTER — Other Ambulatory Visit: Payer: Self-pay

## 2019-09-30 DIAGNOSIS — Z0000 Encounter for general adult medical examination without abnormal findings: Secondary | ICD-10-CM

## 2019-09-30 NOTE — Patient Instructions (Signed)
Adam Hanna , Thank you for taking time to come for your Medicare Wellness Visit. I appreciate your ongoing commitment to your health goals. Please review the following plan we discussed and let me know if I can assist you in the future.   Screening recommendations/referrals: Colonoscopy: No longer required.  Recommended yearly ophthalmology/optometry visit for glaucoma screening and checkup Recommended yearly dental visit for hygiene and checkup  Vaccinations: Influenza vaccine: Up to date Pneumococcal vaccine: Completed series Tdap vaccine: Up to date, due 07/2023 Shingles vaccine: Pt declines today.     Advanced directives: Currently on file  Conditions/risks identified: Recommend to continue to increase water intake to 6-8 8 oz glasses a day.   Next appointment: 12/22/19 @ 9:20 PM with Dr Rosanna Randy   Preventive Care 16 Years and Older, Male Preventive care refers to lifestyle choices and visits with your health care provider that can promote health and wellness. What does preventive care include?  A yearly physical exam. This is also called an annual well check.  Dental exams once or twice a year.  Routine eye exams. Ask your health care provider how often you should have your eyes checked.  Personal lifestyle choices, including:  Daily care of your teeth and gums.  Regular physical activity.  Eating a healthy diet.  Avoiding tobacco and drug use.  Limiting alcohol use.  Practicing safe sex.  Taking low doses of aspirin every day.  Taking vitamin and mineral supplements as recommended by your health care provider. What happens during an annual well check? The services and screenings done by your health care provider during your annual well check will depend on your age, overall health, lifestyle risk factors, and family history of disease. Counseling  Your health care provider may ask you questions about your:  Alcohol use.  Tobacco use.  Drug use.  Emotional  well-being.  Home and relationship well-being.  Sexual activity.  Eating habits.  History of falls.  Memory and ability to understand (cognition).  Work and work Statistician. Screening  You may have the following tests or measurements:  Height, weight, and BMI.  Blood pressure.  Lipid and cholesterol levels. These may be checked every 5 years, or more frequently if you are over 28 years old.  Skin check.  Lung cancer screening. You may have this screening every year starting at age 51 if you have a 30-pack-year history of smoking and currently smoke or have quit within the past 15 years.  Fecal occult blood test (FOBT) of the stool. You may have this test every year starting at age 77.  Flexible sigmoidoscopy or colonoscopy. You may have a sigmoidoscopy every 5 years or a colonoscopy every 10 years starting at age 30.  Prostate cancer screening. Recommendations will vary depending on your family history and other risks.  Hepatitis C blood test.  Hepatitis B blood test.  Sexually transmitted disease (STD) testing.  Diabetes screening. This is done by checking your blood sugar (glucose) after you have not eaten for a while (fasting). You may have this done every 1-3 years.  Abdominal aortic aneurysm (AAA) screening. You may need this if you are a current or former smoker.  Osteoporosis. You may be screened starting at age 40 if you are at high risk. Talk with your health care provider about your test results, treatment options, and if necessary, the need for more tests. Vaccines  Your health care provider may recommend certain vaccines, such as:  Influenza vaccine. This is recommended every year.  Tetanus, diphtheria, and acellular pertussis (Tdap, Td) vaccine. You may need a Td booster every 10 years.  Zoster vaccine. You may need this after age 52.  Pneumococcal 13-valent conjugate (PCV13) vaccine. One dose is recommended after age 26.  Pneumococcal  polysaccharide (PPSV23) vaccine. One dose is recommended after age 84. Talk to your health care provider about which screenings and vaccines you need and how often you need them. This information is not intended to replace advice given to you by your health care provider. Make sure you discuss any questions you have with your health care provider. Document Released: 09/03/2015 Document Revised: 04/26/2016 Document Reviewed: 06/08/2015 Elsevier Interactive Patient Education  2017 [place] Prevention in the Home Falls can cause injuries. They can happen to people of all ages. There are many things you can do to make your home safe and to help prevent falls. What can I do on the outside of my home?  Regularly fix the edges of walkways and driveways and fix any cracks.  Remove anything that might make you trip as you walk through a door, such as a raised step or threshold.  Trim any bushes or trees on the path to your home.  Use bright outdoor lighting.  Clear any walking paths of anything that might make someone trip, such as rocks or tools.  Regularly check to see if handrails are loose or broken. Make sure that both sides of any steps have handrails.  Any raised decks and porches should have guardrails on the edges.  Have any leaves, snow, or ice cleared regularly.  Use sand or salt on walking paths during winter.  Clean up any spills in your garage right away. This includes oil or grease spills. What can I do in the bathroom?  Use night lights.  Install grab bars by the toilet and in the tub and shower. Do not use towel bars as grab bars.  Use non-skid mats or decals in the tub or shower.  If you need to sit down in the shower, use a plastic, non-slip stool.  Keep the floor dry. Clean up any water that spills on the floor as soon as it happens.  Remove soap buildup in the tub or shower regularly.  Attach bath mats securely with double-sided non-slip rug  tape.  Do not have throw rugs and other things on the floor that can make you trip. What can I do in the bedroom?  Use night lights.  Make sure that you have a light by your bed that is easy to reach.  Do not use any sheets or blankets that are too big for your bed. They should not hang down onto the floor.  Have a firm chair that has side arms. You can use this for support while you get dressed.  Do not have throw rugs and other things on the floor that can make you trip. What can I do in the kitchen?  Clean up any spills right away.  Avoid walking on wet floors.  Keep items that you use a lot in easy-to-reach places.  If you need to reach something above you, use a strong step stool that has a grab bar.  Keep electrical cords out of the way.  Do not use floor polish or wax that makes floors slippery. If you must use wax, use non-skid floor wax.  Do not have throw rugs and other things on the floor that can make you trip. What can I do  with my stairs?  Do not leave any items on the stairs.  Make sure that there are handrails on both sides of the stairs and use them. Fix handrails that are broken or loose. Make sure that handrails are as long as the stairways.  Check any carpeting to make sure that it is firmly attached to the stairs. Fix any carpet that is loose or worn.  Avoid having throw rugs at the top or bottom of the stairs. If you do have throw rugs, attach them to the floor with carpet tape.  Make sure that you have a light switch at the top of the stairs and the bottom of the stairs. If you do not have them, ask someone to add them for you. What else can I do to help prevent falls?  Wear shoes that:  Do not have high heels.  Have rubber bottoms.  Are comfortable and fit you well.  Are closed at the toe. Do not wear sandals.  If you use a stepladder:  Make sure that it is fully opened. Do not climb a closed stepladder.  Make sure that both sides of the  stepladder are locked into place.  Ask someone to hold it for you, if possible.  Clearly mark and make sure that you can see:  Any grab bars or handrails.  First and last steps.  Where the edge of each step is.  Use tools that help you move around (mobility aids) if they are needed. These include:  Canes.  Walkers.  Scooters.  Crutches.  Turn on the lights when you go into a dark area. Replace any light bulbs as soon as they burn out.  Set up your furniture so you have a clear path. Avoid moving your furniture around.  If any of your floors are uneven, fix them.  If there are any pets around you, be aware of where they are.  Review your medicines with your doctor. Some medicines can make you feel dizzy. This can increase your chance of falling. Ask your doctor what other things that you can do to help prevent falls. This information is not intended to replace advice given to you by your health care provider. Make sure you discuss any questions you have with your health care provider. Document Released: 06/03/2009 Document Revised: 01/13/2016 Document Reviewed: 09/11/2014 Elsevier Interactive Patient Education  2017 [employer].

## 2019-10-06 DIAGNOSIS — M25569 Pain in unspecified knee: Secondary | ICD-10-CM | POA: Diagnosis not present

## 2019-10-06 DIAGNOSIS — M5416 Radiculopathy, lumbar region: Secondary | ICD-10-CM | POA: Diagnosis not present

## 2019-10-06 DIAGNOSIS — M6281 Muscle weakness (generalized): Secondary | ICD-10-CM | POA: Diagnosis not present

## 2019-10-16 ENCOUNTER — Encounter: Payer: Self-pay | Admitting: Family Medicine

## 2019-10-17 NOTE — Telephone Encounter (Signed)
Called to schedule appointment. However, patient wants to think about it before scheduling the appointment.

## 2019-10-20 ENCOUNTER — Other Ambulatory Visit: Payer: Self-pay | Admitting: *Deleted

## 2019-10-20 DIAGNOSIS — I10 Essential (primary) hypertension: Secondary | ICD-10-CM

## 2019-10-20 MED ORDER — TELMISARTAN 20 MG PO TABS: 20.0000 mg | ORAL_TABLET | Freq: Every day | ORAL | 0 refills | Status: DC

## 2019-11-12 ENCOUNTER — Other Ambulatory Visit: Payer: Self-pay | Admitting: Family Medicine

## 2019-11-12 DIAGNOSIS — I10 Essential (primary) hypertension: Secondary | ICD-10-CM

## 2019-11-12 MED ORDER — METOPROLOL SUCCINATE ER 25 MG PO TB24: 25.0000 mg | ORAL_TABLET | Freq: Two times a day (BID) | ORAL | 3 refills | Status: DC

## 2019-11-12 NOTE — Telephone Encounter (Signed)
Please advise? Medication has never been filled by you.  

## 2019-11-12 NOTE — Telephone Encounter (Signed)
Requested medication (s) are due for refill today - yes- if patient is taking as reported 2/day  Requested medication (s) are on the active medication list -yes  Future visit scheduled -yes  Last refill: written 04/24/19  Notes to clinic: Patient is requesting refill of medication written by outside provider  Requested Prescriptions  Pending Prescriptions Disp Refills   metoprolol succinate (TOPROL XL) 25 MG 24 hr tablet 30 tablet 11    Sig: Take 1 tablet (25 mg total) by mouth daily.      Cardiovascular:  Beta Blockers Failed - 11/12/2019 12:24 PM      Failed - Last BP in normal range    BP Readings from Last 1 Encounters:  09/22/19 (!) 185/74          Passed - Last Heart Rate in normal range    Pulse Readings from Last 1 Encounters:  09/22/19 (!) 68          Passed - Valid encounter within last 6 months    Recent Outpatient Visits           1 month ago Essential hypertension   [facility] Jerrol Banana., MD   2 months ago Weakness of left lower extremity   [facility] Jerrol Banana., MD   9 months ago Essential hypertension   [facility] Jerrol Banana., MD   1 year ago Essential hypertension   [facility] Jerrol Banana., MD   1 year ago Essential hypertension   [facility] Jerrol Banana., MD       Future Appointments             In 3 weeks Jerrol Banana., MD [facility], [facility]                Requested Prescriptions  Pending Prescriptions Disp Refills   metoprolol succinate (TOPROL XL) 25 MG 24 hr tablet 30 tablet 11    Sig: Take 1 tablet (25 mg total) by mouth daily.      Cardiovascular:  Beta Blockers Failed - 11/12/2019 12:24 PM      Failed - Last BP in normal range    BP Readings from Last 1 Encounters:  09/22/19 (!) 185/74          Passed - Last Heart Rate in normal range    Pulse Readings from Last 1  Encounters:  09/22/19 (!) 45          Passed - Valid encounter within last 6 months    Recent Outpatient Visits           1 month ago Essential hypertension   [facility] Jerrol Banana., MD   2 months ago Weakness of left lower extremity   [facility] Jerrol Banana., MD   9 months ago Essential hypertension   [facility] Jerrol Banana., MD   1 year ago Essential hypertension   [facility] Jerrol Banana., MD   1 year ago Essential hypertension   [facility] Jerrol Banana., MD       Future Appointments             In 3 weeks Jerrol Banana., MD [facility], [place]

## 2019-11-12 NOTE — Telephone Encounter (Signed)
Copied from [place] [id number]. Topic: Quick Communication - Rx Refill/Question >> Nov 12, 2019 12:21 PM Izola Price, [place] A wrote: Medication: metoprolol succinate (TOPROL XL) 25 MG 24 hr tablet (Patient wants to stay with 25mg )  Has the patient contacted their pharmacy? Yes (Agent: If no, request that the patient contact the pharmacy for the refill.) (Agent: If yes, when and what did the pharmacy advise?)Contact PCP  Preferred Pharmacy (with phone number or street name): [place], [place]  Phone:  [phone] Fax:  [phone]     Agent: Please be advised that RX refills may take up to 3 business days. We ask that you follow-up with your pharmacy.

## 2019-12-05 NOTE — Progress Notes (Signed)
Established patient visit     Patient: Adam Hanna.   DOB: 08/05/1926   86 y.o. Male  MRN: [id number] Visit Date: 12/08/2019  I,Sulibeya S Dimas,acting as a scribe for Wilhemena Durie, MD.,have documented all relevant documentation on the behalf of Wilhemena Durie, MD,as directed by  Wilhemena Durie, MD while in the presence of Wilhemena Durie, MD.  Today's healthcare provider: Wilhemena Durie, MD  Subjective:    Chief Complaint  Patient presents with  . Hypertension   Hypertension, follow-up  BP Readings from Last 3 Encounters:  12/08/19 125/74  09/22/19 (!) 185/74  08/21/19 (!) 159/81   Wt Readings from Last 3 Encounters:  12/08/19 207 lb (93.9 kg)  09/22/19 210 lb 12.8 oz (95.6 kg)  08/21/19 208 lb (94.3 kg)   He was last seen for hypertension 2 months ago.  BP at that visit was 185/74. Management since that visit includes no changes continue current medications. Patient was started on telmisartan 20 mg daily on 10/20/2019. He reports excellent compliance with treatment. He is not having side effects.  He is not exercising. He is not adherent to low salt diet.   Outside blood pressures are 110/63-164/93. Symptoms: No chest pain No chest pressure/discomfort No dyspnea (difficulty breathing) No lower extremity edema No orthopnea No palpitations No paroxysmal nocturnal dyspnea  No syncope  He does not smoke. He is following a Regular diet. Use of agents associated with hypertension: none.   Last basic metabolic panel Lab Results  Component Value Date   GLUCOSE 97 09/22/2019   NA 142 09/22/2019   K 4.9 09/22/2019   CL 102 09/22/2019   CO2 25 09/22/2019   BUN 17 09/22/2019   CREATININE 1.13 09/22/2019   GFRNONAA 57 (L) 09/22/2019   GFRAA 65 09/22/2019   CALCIUM 9.4 09/22/2019   Last lipids Lab Results  Component Value Date   GLUCOSE 97 09/22/2019   NA 142 09/22/2019   K 4.9 09/22/2019   CL 102 09/22/2019   CO2 25 09/22/2019    BUN 17 09/22/2019   CREATININE 1.13 09/22/2019   GFRNONAA 57 (L) 09/22/2019   GFRAA 65 09/22/2019   CALCIUM 9.4 09/22/2019   PROT 7.0 09/22/2019   ALBUMIN 4.4 09/22/2019   LABGLOB 2.6 09/22/2019   AGRATIO 1.7 09/22/2019   BILITOT 0.7 09/22/2019   ALKPHOS 136 (H) 09/22/2019   AST 25 09/22/2019   ALT 21 09/22/2019   ANIONGAP 9 04/24/2019   The ASCVD Risk score (Goff DC Jr., et al., 2013) failed to calculate for the following reasons:   The 2013 ASCVD risk score is only valid for ages 37 to 86   The patient has a prior MI or stroke diagnosis   -----------------------------------------------------------------------------------------  Social History   Tobacco Use  . Smoking status: Former Smoker    Packs/day: 1.00    Years: 20.00    Pack years: 20.00    Types: Cigarettes    Quit date: 08/21/1963    Years since quitting: 56.3  . Smokeless tobacco: Never Used  Substance Use Topics  . Alcohol use: Yes    Alcohol/week: 0.0 - 12.0 standard drinks    Comment: 1 beer , wine or scotch before dinner  . Drug use: No       Medications: Outpatient Medications Prior to Visit  Medication Sig  . aspirin EC 81 MG tablet Take 1 tablet (81 mg total) by mouth daily.  . isosorbide mononitrate (IMDUR) 30 MG 24  hr tablet Take 30 mg by mouth daily.  . metoprolol succinate (TOPROL XL) 25 MG 24 hr tablet Take 1 tablet (25 mg total) by mouth 2 (two) times daily.  . nitroGLYCERIN (NITROSTAT) 0.4 MG SL tablet Place 0.4 mg under the tongue every 5 (five) minutes as needed.   . pantoprazole (PROTONIX) 20 MG tablet Take 1 tablet (20 mg total) by mouth daily.  Marland Kitchen telmisartan (MICARDIS) 20 MG tablet Take 1 tablet (20 mg total) by mouth daily.  . [DISCONTINUED] ezetimibe (ZETIA) 10 MG tablet TAKE 1 TABLET EVERY MORNING (Patient not taking: Reported on 01/16/2018)  . [DISCONTINUED] meclizine (ANTIVERT) 25 MG tablet Take 1 tablet (25 mg total) by mouth 3 (three) times daily as needed for dizziness.  (Patient not taking: Reported on 12/08/2019)  . [DISCONTINUED] naproxen sodium (ALEVE) 220 MG tablet Take 220 mg by mouth 2 (two) times daily.   . [DISCONTINUED] NIFEdipine (PROCARDIA XL/NIFEDICAL XL) 60 MG 24 hr tablet Take 1 tablet (60 mg total) by mouth daily. (Patient not taking: Reported on 09/22/2019)  . [DISCONTINUED] oxybutynin (DITROPAN XL) 10 MG 24 hr tablet Take 1 tablet (10 mg total) by mouth at bedtime. (Patient not taking: Reported on 01/16/2018)  . [DISCONTINUED] ranitidine (ZANTAC) 150 MG tablet Take 1 tablet (150 mg total) at bedtime by mouth. (Patient not taking: Reported on 08/21/2019)   No facility-administered medications prior to visit.    Review of Systems  Constitutional: Negative for appetite change, chills and fever.  HENT: Positive for postnasal drip.   Eyes: Negative.   Respiratory: Negative for chest tightness, shortness of breath and wheezing.   Cardiovascular: Negative for chest pain and palpitations.  Gastrointestinal: Negative for abdominal pain, nausea and vomiting.  Endocrine: Negative.   Musculoskeletal: Negative.   Allergic/Immunologic: Negative.   Hematological: Negative.   Psychiatric/Behavioral: Negative.        Objective:    BP 125/74 (BP Location: Right Arm, Patient Position: Sitting, Cuff Size: Normal)   Pulse 60   Temp (!) 96.9 F (36.1 C) (Temporal)   Resp 16   Ht 6\' 1"  (1.854 m)   Wt 207 lb (93.9 kg)   SpO2 96%   BMI 27.31 kg/m  BP Readings from Last 3 Encounters:  12/08/19 125/74  09/22/19 (!) 185/74  08/21/19 (!) 159/81   Wt Readings from Last 3 Encounters:  12/08/19 207 lb (93.9 kg)  09/22/19 210 lb 12.8 oz (95.6 kg)  08/21/19 208 lb (94.3 kg)      Physical Exam Vitals reviewed.  Constitutional:      General: He is not in acute distress.    Appearance: Normal appearance. He is well-developed. He is not diaphoretic.  HENT:     Head: Normocephalic and atraumatic.     Right Ear: External ear normal.     Left Ear:  External ear normal.     Nose: Nose normal.  Eyes:     General: No scleral icterus.    Conjunctiva/sclera: Conjunctivae normal.  Neck:     Thyroid: No thyromegaly.  Cardiovascular:     Rate and Rhythm: Normal rate and regular rhythm.     Heart sounds: Normal heart sounds. No murmur.  Pulmonary:     Effort: Pulmonary effort is normal. No respiratory distress.     Breath sounds: Normal breath sounds. No stridor. No rales.  Abdominal:     Palpations: Abdomen is soft.  Musculoskeletal:     Cervical back: Neck supple.  Skin:    General: Skin is warm  and dry.  Neurological:     Mental Status: He is alert and oriented to person, place, and time.  Psychiatric:        Mood and Affect: Mood normal.        Behavior: Behavior normal.        Thought Content: Thought content normal.        Judgment: Judgment normal.     No results found for any visits on 12/08/19.    Assessment & Plan:    Problem List Items Addressed This Visit      Cardiovascular and Mediastinum   BP (high blood pressure) - Primary    Well controlled Continue current medications F/u in 6 months         Other   Dyslipidemia    Stable Reviewed last lipid panel Not currently on a statin Discussed diet and exercise        Other Visit Diagnoses    Abdominal aortic aneurysm (AAA) without rupture ([place]a)       Relevant Orders   Ambulatory referral to Vascular Surgery    Allergic rhinitis Try Robitussin every night to help assist with drainage.  He wishes no antihistamines or nasal spray for now.  Return in about 6 months (around 06/08/2020) for chronic disease f/u.        Jesselle Laflamme Cranford Mon, MD  [facility] [phone] (phone) [phone] (fax)  [place]

## 2019-12-08 ENCOUNTER — Other Ambulatory Visit: Payer: Self-pay

## 2019-12-08 ENCOUNTER — Encounter: Payer: Self-pay | Admitting: Family Medicine

## 2019-12-08 ENCOUNTER — Ambulatory Visit (INDEPENDENT_AMBULATORY_CARE_PROVIDER_SITE_OTHER): Payer: Medicare Other | Admitting: Family Medicine

## 2019-12-08 VITALS — BP 125/74 | HR 60 | Temp 96.9°F | Resp 16 | Ht 73.0 in | Wt 207.0 lb

## 2019-12-08 DIAGNOSIS — I714 Abdominal aortic aneurysm, without rupture, unspecified: Secondary | ICD-10-CM

## 2019-12-08 DIAGNOSIS — J301 Allergic rhinitis due to pollen: Secondary | ICD-10-CM

## 2019-12-08 DIAGNOSIS — I10 Essential (primary) hypertension: Secondary | ICD-10-CM

## 2019-12-08 DIAGNOSIS — I208 Other forms of angina pectoris: Secondary | ICD-10-CM | POA: Diagnosis not present

## 2019-12-08 DIAGNOSIS — E785 Hyperlipidemia, unspecified: Secondary | ICD-10-CM

## 2019-12-08 NOTE — Assessment & Plan Note (Signed)
Well controlled Continue current medications F/u in 6 months 

## 2019-12-08 NOTE — Assessment & Plan Note (Signed)
Stable Reviewed last lipid panel Not currently on a statin Discussed diet and exercise

## 2019-12-11 DIAGNOSIS — E782 Mixed hyperlipidemia: Secondary | ICD-10-CM | POA: Diagnosis not present

## 2019-12-11 DIAGNOSIS — I700 Atherosclerosis of aorta: Secondary | ICD-10-CM | POA: Diagnosis not present

## 2019-12-11 DIAGNOSIS — I208 Other forms of angina pectoris: Secondary | ICD-10-CM | POA: Diagnosis not present

## 2019-12-11 DIAGNOSIS — I10 Essential (primary) hypertension: Secondary | ICD-10-CM | POA: Diagnosis not present

## 2019-12-12 ENCOUNTER — Encounter: Payer: Self-pay | Admitting: Family Medicine

## 2019-12-22 ENCOUNTER — Ambulatory Visit: Payer: Self-pay | Admitting: Family Medicine

## 2019-12-24 DIAGNOSIS — M2042 Other hammer toe(s) (acquired), left foot: Secondary | ICD-10-CM | POA: Diagnosis not present

## 2019-12-24 DIAGNOSIS — M19072 Primary osteoarthritis, left ankle and foot: Secondary | ICD-10-CM | POA: Diagnosis not present

## 2019-12-24 DIAGNOSIS — M19071 Primary osteoarthritis, right ankle and foot: Secondary | ICD-10-CM | POA: Diagnosis not present

## 2019-12-24 DIAGNOSIS — M2041 Other hammer toe(s) (acquired), right foot: Secondary | ICD-10-CM | POA: Diagnosis not present

## 2020-02-16 ENCOUNTER — Ambulatory Visit (INDEPENDENT_AMBULATORY_CARE_PROVIDER_SITE_OTHER): Payer: Medicare Other | Admitting: Dermatology

## 2020-02-16 ENCOUNTER — Other Ambulatory Visit: Payer: Self-pay

## 2020-02-16 DIAGNOSIS — D1800 Hemangioma unspecified site: Secondary | ICD-10-CM

## 2020-02-16 DIAGNOSIS — Z85828 Personal history of other malignant neoplasm of skin: Secondary | ICD-10-CM

## 2020-02-16 DIAGNOSIS — I208 Other forms of angina pectoris: Secondary | ICD-10-CM

## 2020-02-16 DIAGNOSIS — L570 Actinic keratosis: Secondary | ICD-10-CM

## 2020-02-16 DIAGNOSIS — L304 Erythema intertrigo: Secondary | ICD-10-CM

## 2020-02-16 DIAGNOSIS — C44311 Basal cell carcinoma of skin of nose: Secondary | ICD-10-CM | POA: Diagnosis not present

## 2020-02-16 DIAGNOSIS — L821 Other seborrheic keratosis: Secondary | ICD-10-CM

## 2020-02-16 DIAGNOSIS — Z1283 Encounter for screening for malignant neoplasm of skin: Secondary | ICD-10-CM | POA: Diagnosis not present

## 2020-02-16 DIAGNOSIS — D225 Melanocytic nevi of trunk: Secondary | ICD-10-CM | POA: Diagnosis not present

## 2020-02-16 DIAGNOSIS — Z85820 Personal history of malignant melanoma of skin: Secondary | ICD-10-CM

## 2020-02-16 DIAGNOSIS — D229 Melanocytic nevi, unspecified: Secondary | ICD-10-CM

## 2020-02-16 DIAGNOSIS — L578 Other skin changes due to chronic exposure to nonionizing radiation: Secondary | ICD-10-CM

## 2020-02-16 DIAGNOSIS — L814 Other melanin hyperpigmentation: Secondary | ICD-10-CM

## 2020-02-16 DIAGNOSIS — D485 Neoplasm of uncertain behavior of skin: Secondary | ICD-10-CM

## 2020-02-16 NOTE — Patient Instructions (Addendum)
Recommend daily broad spectrum sunscreen SPF 30+ to sun-exposed areas, reapply every 2 hours as needed. Call for new or changing lesions.  Wound Care Instructions  1. Cleanse wound gently with soap and water once a day then pat dry with clean gauze. Apply a thing coat of Petrolatum (petroleum jelly, "Vaseline") over the wound (unless you have an allergy to this). We recommend that you use a new, sterile tube of Vaseline. Do not pick or remove scabs. Do not remove the yellow or white "healing tissue" from the base of the wound.  2. Cover the wound with fresh, clean, nonstick gauze and secure with paper tape. You may use Band-Aids in place of gauze and tape if the would is small enough, but would recommend trimming much of the tape off as there is often too much. Sometimes Band-Aids can irritate the skin.  3. You should call the office for your biopsy report after 1 week if you have not already been contacted.  4. If you experience any problems, such as abnormal amounts of bleeding, swelling, significant bruising, significant pain, or evidence of infection, please call the office immediately.  5. FOR ADULT SURGERY PATIENTS: If you need something for pain relief you may take 1 extra strength Tylenol (acetaminophen) AND 2 Ibuprofen (200mg  each) together every 4 hours as needed for pain. (do not take these if you are allergic to them or if you have a reason you should not take them.) Typically, you may only need pain medication for 1 to 3 days.   Cryotherapy Aftercare  . Wash gently with soap and water everyday.   Marland Kitchen Apply Vaseline and Band-Aid daily until healed.

## 2020-02-16 NOTE — Progress Notes (Signed)
Follow-Up Visit   Subjective  Adam Hanna. is a 86 y.o. male who presents for the following: Annual Exam. The patient presents for Total-Body Skin Exam (TBSE) for skin cancer screening and mole check.  Patient here today for TBSE. He has a history of melanoma, SCC, BCC and Dyplastic Nevi. He did have a SCC on the tip of his nose a few years ago and would like to have that spot rechecked.   The following portions of the chart were reviewed this encounter and updated as appropriate:  Tobacco  Allergies  Meds  Problems  Med Hx  Surg Hx  Fam Hx      Review of Systems:  No other skin or systemic complaints except as noted in HPI or Assessment and Plan.  Objective  Well appearing patient in no apparent distress; mood and affect are within normal limits.  A full examination was performed including scalp, head, eyes, ears, nose, lips, neck, chest, axillae, abdomen, back, buttocks, bilateral upper extremities, bilateral lower extremities, hands, feet, fingers, toes, fingernails, and toenails. All findings within normal limits unless otherwise noted below.  Objective  Left nasal tip: Scaly patch 0.5cm  Objective  Right Lower Back above waistline: Congential nevus  Objective  groin: Erythema   Objective  face, scalp x 12 (12): Erythematous thin papules/macules with gritty scale.    Assessment & Plan    Neoplasm of uncertain behavior of skin Left nasal tip  Skin / nail biopsy Type of biopsy: tangential   Informed consent: discussed and consent obtained   Timeout: patient name, date of birth, surgical site, and procedure verified   Procedure prep:  Patient was prepped and draped in usual sterile fashion Prep type:  Isopropyl alcohol Anesthesia: the lesion was anesthetized in a standard fashion   Anesthetic:  1% lidocaine w/ epinephrine 1-100,000 buffered w/ 8.4% NaHCO3 Instrument used: flexible razor blade   Hemostasis achieved with: pressure, aluminum chloride and  electrodesiccation   Outcome: patient tolerated procedure well   Post-procedure details: sterile dressing applied and wound care instructions given   Dressing type: petrolatum and bandage    Specimen 1 - Surgical pathology Differential Diagnosis: r/o AK vs SCC Check Margins: No Scaly patch 0.5cm  Nevus Right Lower Back above waistline  Benign, observe.    Erythema intertrigo groin  Cont Alcortin A, patient has  AK (actinic keratosis) (12) face, scalp x 12  Destruction of lesion - face, scalp x 12 Complexity: simple   Destruction method: cryotherapy   Informed consent: discussed and consent obtained   Timeout:  patient name, date of birth, surgical site, and procedure verified Lesion destroyed using liquid nitrogen: Yes   Region frozen until ice ball extended beyond lesion: Yes   Outcome: patient tolerated procedure well with no complications   Post-procedure details: wound care instructions given    Skin cancer screening   Lentigines - Scattered tan macules - Discussed due to sun exposure - Benign, observe - Call for any changes  Seborrheic Keratoses - Stuck-on, waxy, tan-brown papules and plaques  - Discussed benign etiology and prognosis. - Observe - Call for any changes  Melanocytic Nevi - Tan-brown and/or pink-flesh-colored symmetric macules and papules - Benign appearing on exam today - Observation - Call clinic for new or changing moles - Recommend daily use of broad spectrum spf 30+ sunscreen to sun-exposed areas.   Hemangiomas - Red papules - Discussed benign nature - Observe - Call for any changes  Actinic Damage - diffuse scaly  erythematous macules with underlying dyspigmentation - Recommend daily broad spectrum sunscreen SPF 30+ to sun-exposed areas, reapply every 2 hours as needed.  - Call for new or changing lesions.  Skin cancer screening performed today.  History of Basal Cell Carcinoma of the Skin - No evidence of recurrence  today - Recommend regular full body skin exams - Recommend daily broad spectrum sunscreen SPF 30+ to sun-exposed areas, reapply every 2 hours as needed.  - Call if any new or changing lesions are noted between office visits  History of Squamous Cell Carcinoma of the Skin - No evidence of recurrence today - No lymphadenopathy - Recommend regular full body skin exams - Recommend daily broad spectrum sunscreen SPF 30+ to sun-exposed areas, reapply every 2 hours as needed.  - Call if any new or changing lesions are noted between office visits  History of Melanoma - No evidence of recurrence today - No lymphadenopathy - Recommend regular full body skin exams - Recommend daily broad spectrum sunscreen SPF 30+ to sun-exposed areas, reapply every 2 hours as needed.  - Call if any new or changing lesions are noted between office visits  Return in about 6 months (around 08/17/2020) for AK follow up, UBSE.  Graciella Belton, RMA, am acting as scribe for Sarina Ser, MD . Documentation: I have reviewed the above documentation for accuracy and completeness, and I agree with the above.  Sarina Ser, MD

## 2020-02-18 ENCOUNTER — Telehealth: Payer: Self-pay

## 2020-02-18 DIAGNOSIS — C44311 Basal cell carcinoma of skin of nose: Secondary | ICD-10-CM

## 2020-02-18 NOTE — Telephone Encounter (Signed)
Patient informed of pathology results 

## 2020-02-18 NOTE — Telephone Encounter (Signed)
-----   Message from Ralene Bathe, MD sent at 02/17/2020  6:41 PM EDT ----- Skin , left nasal tip BASAL CELL CARCINOMA, INFILTRATIVE PATTERN  Schedule for MOHS

## 2020-02-19 ENCOUNTER — Encounter: Payer: Self-pay | Admitting: Dermatology

## 2020-03-04 ENCOUNTER — Other Ambulatory Visit: Payer: Self-pay | Admitting: Family Medicine

## 2020-03-10 DIAGNOSIS — C44311 Basal cell carcinoma of skin of nose: Secondary | ICD-10-CM | POA: Diagnosis not present

## 2020-03-31 ENCOUNTER — Other Ambulatory Visit: Payer: Self-pay | Admitting: Family Medicine

## 2020-03-31 DIAGNOSIS — I10 Essential (primary) hypertension: Secondary | ICD-10-CM

## 2020-04-06 ENCOUNTER — Other Ambulatory Visit: Payer: Self-pay | Admitting: *Deleted

## 2020-04-06 DIAGNOSIS — I10 Essential (primary) hypertension: Secondary | ICD-10-CM

## 2020-04-06 MED ORDER — TELMISARTAN 40 MG PO TABS: 40.0000 mg | ORAL_TABLET | Freq: Every day | ORAL | 0 refills | Status: DC

## 2020-04-27 DIAGNOSIS — C44311 Basal cell carcinoma of skin of nose: Secondary | ICD-10-CM | POA: Diagnosis not present

## 2020-05-10 ENCOUNTER — Ambulatory Visit (INDEPENDENT_AMBULATORY_CARE_PROVIDER_SITE_OTHER): Payer: Medicare Other | Admitting: Family Medicine

## 2020-05-10 ENCOUNTER — Other Ambulatory Visit: Payer: Self-pay

## 2020-05-10 DIAGNOSIS — Z23 Encounter for immunization: Secondary | ICD-10-CM | POA: Diagnosis not present

## 2020-05-28 ENCOUNTER — Other Ambulatory Visit: Payer: Self-pay | Admitting: Family Medicine

## 2020-05-28 DIAGNOSIS — I10 Essential (primary) hypertension: Secondary | ICD-10-CM

## 2020-06-10 ENCOUNTER — Ambulatory Visit: Payer: Self-pay | Admitting: Family Medicine

## 2020-08-10 DIAGNOSIS — I700 Atherosclerosis of aorta: Secondary | ICD-10-CM | POA: Diagnosis not present

## 2020-08-10 DIAGNOSIS — E782 Mixed hyperlipidemia: Secondary | ICD-10-CM | POA: Diagnosis not present

## 2020-08-25 ENCOUNTER — Other Ambulatory Visit: Payer: Self-pay

## 2020-08-25 ENCOUNTER — Ambulatory Visit (INDEPENDENT_AMBULATORY_CARE_PROVIDER_SITE_OTHER): Payer: Medicare Other | Admitting: Dermatology

## 2020-08-25 ENCOUNTER — Encounter: Payer: Self-pay | Admitting: Dermatology

## 2020-08-25 DIAGNOSIS — Z1283 Encounter for screening for malignant neoplasm of skin: Secondary | ICD-10-CM | POA: Diagnosis not present

## 2020-08-25 DIAGNOSIS — Z85828 Personal history of other malignant neoplasm of skin: Secondary | ICD-10-CM | POA: Diagnosis not present

## 2020-08-25 DIAGNOSIS — D1800 Hemangioma unspecified site: Secondary | ICD-10-CM

## 2020-08-25 DIAGNOSIS — L304 Erythema intertrigo: Secondary | ICD-10-CM

## 2020-08-25 DIAGNOSIS — L814 Other melanin hyperpigmentation: Secondary | ICD-10-CM

## 2020-08-25 DIAGNOSIS — L821 Other seborrheic keratosis: Secondary | ICD-10-CM

## 2020-08-25 DIAGNOSIS — L820 Inflamed seborrheic keratosis: Secondary | ICD-10-CM | POA: Diagnosis not present

## 2020-08-25 DIAGNOSIS — D229 Melanocytic nevi, unspecified: Secondary | ICD-10-CM

## 2020-08-25 DIAGNOSIS — L570 Actinic keratosis: Secondary | ICD-10-CM | POA: Diagnosis not present

## 2020-08-25 DIAGNOSIS — Z86018 Personal history of other benign neoplasm: Secondary | ICD-10-CM | POA: Diagnosis not present

## 2020-08-25 DIAGNOSIS — C44612 Basal cell carcinoma of skin of right upper limb, including shoulder: Secondary | ICD-10-CM | POA: Diagnosis not present

## 2020-08-25 DIAGNOSIS — D485 Neoplasm of uncertain behavior of skin: Secondary | ICD-10-CM

## 2020-08-25 DIAGNOSIS — L578 Other skin changes due to chronic exposure to nonionizing radiation: Secondary | ICD-10-CM

## 2020-08-25 NOTE — Patient Instructions (Signed)

## 2020-08-25 NOTE — Progress Notes (Signed)
Follow-Up Visit   Subjective  Adam Hanna. is a 87 y.o. male who presents for the following: UBSE  (Hx of BCC, SCC, dysplastic nevi). Lesion on the right forearm - grew to the size of a quarter, small now, but still not resolved. The patient presents for Upper Body Skin Exam (UBSE) for skin cancer screening and mole check.  The following portions of the chart were reviewed this encounter and updated as appropriate:   Tobacco  Allergies  Meds  Problems  Med Hx  Surg Hx  Fam Hx     Review of Systems:  No other skin or systemic complaints except as noted in HPI or Assessment and Plan.  Objective  Well appearing patient in no apparent distress; mood and affect are within normal limits.  All skin waist up examined.  Objective  Scalp x 5, face x 2 (7): Erythematous thin papules/macules with gritty scale.   Objective  R shoulder x 1: Erythematous keratotic or waxy stuck-on papule or plaque.   Objective  R 5th toe: Fissure  Objective  R prox forearm near the elbow: 1.0 cm hyperkeratotic papule   Assessment & Plan  AK (actinic keratosis) (7) Scalp x 5, face x 2  Destruction of lesion - Scalp x 5, face x 2 Complexity: simple   Destruction method: cryotherapy   Informed consent: discussed and consent obtained   Timeout:  patient name, date of birth, surgical site, and procedure verified Lesion destroyed using liquid nitrogen: Yes   Region frozen until ice ball extended beyond lesion: Yes   Outcome: patient tolerated procedure well with no complications   Post-procedure details: wound care instructions given    Inflamed seborrheic keratosis R shoulder x 1  Destruction of lesion - R shoulder x 1 Complexity: simple   Destruction method: cryotherapy   Informed consent: discussed and consent obtained   Timeout:  patient name, date of birth, surgical site, and procedure verified Lesion destroyed using liquid nitrogen: Yes   Region frozen until ice ball extended  beyond lesion: Yes   Outcome: patient tolerated procedure well with no complications   Post-procedure details: wound care instructions given    Erythema intertrigo R 5th toe webspace and underneath toe crease Intertrigo is a chronic recurrent rash that occurs in skin fold areas that may be associated with friction; heat; moisture; yeast; fungus; and bacteria.  It is exacerbated by increased movement / activity; sweating; and higher atmospheric temperature.   Apply Alcortin A to aa's QD.  Patient has this Rx  Neoplasm of uncertain behavior of skin R prox forearm near the elbow  Epidermal / dermal shaving  Lesion diameter (cm):  1 Informed consent: discussed and consent obtained   Timeout: patient name, date of birth, surgical site, and procedure verified   Procedure prep:  Patient was prepped and draped in usual sterile fashion Prep type:  Isopropyl alcohol Anesthesia: the lesion was anesthetized in a standard fashion   Anesthetic:  1% lidocaine w/ epinephrine 1-100,000 buffered w/ 8.4% NaHCO3 Instrument used: flexible razor blade   Hemostasis achieved with: pressure, aluminum chloride and electrodesiccation   Outcome: patient tolerated procedure well   Post-procedure details: sterile dressing applied and wound care instructions given   Dressing type: bandage and petrolatum    Destruction of lesion Complexity: extensive   Destruction method: electrodesiccation and curettage   Informed consent: discussed and consent obtained   Timeout:  patient name, date of birth, surgical site, and procedure verified Procedure prep:  Patient  was prepped and draped in usual sterile fashion Prep type:  Isopropyl alcohol Anesthesia: the lesion was anesthetized in a standard fashion   Anesthetic:  1% lidocaine w/ epinephrine 1-100,000 buffered w/ 8.4% NaHCO3 Curettage performed in three different directions: Yes   Electrodesiccation performed over the curetted area: Yes   Lesion length (cm):   1 Lesion width (cm):  1 Margin per side (cm):  0.2 Final wound size (cm):  1.4 Hemostasis achieved with:  pressure, aluminum chloride and electrodesiccation Outcome: patient tolerated procedure well with no complications   Post-procedure details: sterile dressing applied and wound care instructions given   Dressing type: bandage and petrolatum    Specimen 1 - Surgical pathology Differential Diagnosis: D48.5 r/o SCC vs other - ED&C today  Check Margins: No 1.0 cm hyperkeratotic papule   Lentigines - Scattered tan macules - Discussed due to sun exposure - Benign, observe - Call for any changes  Seborrheic Keratoses - Stuck-on, waxy, tan-brown papules and plaques  - Discussed benign etiology and prognosis. - Observe - Call for any changes  Melanocytic Nevi - Tan-brown and/or pink-flesh-colored symmetric macules and papules - Benign appearing on exam today - Observation - Call clinic for new or changing moles - Recommend daily use of broad spectrum spf 30+ sunscreen to sun-exposed areas.   Hemangiomas - Red papules - Discussed benign nature - Observe - Call for any changes  Actinic Damage - Chronic, secondary to cumulative UV/sun exposure - diffuse scaly erythematous macules with underlying dyspigmentation - Recommend daily broad spectrum sunscreen SPF 30+ to sun-exposed areas, reapply every 2 hours as needed.  - Call for new or changing lesions.  History of Basal Cell Carcinoma of the Skin - No evidence of recurrence today - Recommend regular full body skin exams - Recommend daily broad spectrum sunscreen SPF 30+ to sun-exposed areas, reapply every 2 hours as needed.  - Call if any new or changing lesions are noted between office visits  History of Squamous Cell Carcinoma of the Skin - No evidence of recurrence today - No lymphadenopathy - Recommend regular full body skin exams - Recommend daily broad spectrum sunscreen SPF 30+ to sun-exposed areas, reapply every 2  hours as needed.  - Call if any new or changing lesions are noted between office visits  History of Dysplastic Nevi - No evidence of recurrence today - Recommend regular full body skin exams - Recommend daily broad spectrum sunscreen SPF 30+ to sun-exposed areas, reapply every 2 hours as needed.  - Call if any new or changing lesions are noted between office visits  Skin cancer screening performed today.  Return in about 6 months (around 02/22/2021) for TBSE.  Luther Redo, CMA, am acting as scribe for Sarina Ser, MD .  Documentation: I have reviewed the above documentation for accuracy and completeness, and I agree with the above.  Sarina Ser, MD

## 2020-08-26 ENCOUNTER — Encounter: Payer: Self-pay | Admitting: Dermatology

## 2020-09-07 ENCOUNTER — Telehealth: Payer: Self-pay

## 2020-09-07 NOTE — Telephone Encounter (Signed)
Patient informed of pathology results 

## 2020-09-07 NOTE — Telephone Encounter (Signed)
-----   Message from Ralene Bathe, MD sent at 09/04/2020  2:16 PM EST ----- Diagnosis Skin , right prox forearm near the elbow BASAL CELL CARCINOMA, NODULAR PATTERN  Cancer - BCC Already treated Recheck next visit

## 2020-10-04 NOTE — Progress Notes (Signed)
Subjective:   Adam Hanna. is a 87 y.o. male who presents for Medicare Annual/Subsequent preventive examination.  I connected with Cheree Ditto today by telephone and verified that I am speaking with the correct person using two identifiers. Location patient: home Location provider: work Persons participating in the virtual visit: patient, provider.   I discussed the limitations, risks, security and privacy concerns of performing an evaluation and management service by telephone and the availability of in person appointments. I also discussed with the patient that there may be a patient responsible charge related to this service. The patient expressed understanding and verbally consented to this telephonic visit.    Interactive audio and video telecommunications were attempted between this provider and patient, however failed, due to patient having technical difficulties OR patient did not have access to video capability.  We continued and completed visit with audio only.   Review of Systems    N/A  Cardiac Risk Factors include: advanced age (>8men, >58 women);hypertension;dyslipidemia;male gender     Objective:    There were no vitals filed for this visit. There is no height or weight on file to calculate BMI.  Advanced Directives 10/05/2020 09/30/2019 04/24/2019 03/18/2018 03/16/2017 02/22/2016 02/23/2015  Does Patient Have a Medical Advance Directive? Yes Yes No Yes Yes Yes Yes  Type of Paramedic of [place];Living will [place];Living will - [place];Living will [place];Living will [place];Living will -  Does patient want to make changes to medical advance directive? - - - - - No - Patient declined -  Copy of [place] in Chart? Yes - validated most recent copy scanned in chart (See row information) Yes - validated most recent copy scanned in chart (See row  information) - Yes Yes No - copy requested -  Would patient like information on creating a medical advance directive? - - No - Patient declined - - - -    Current Medications (verified) Outpatient Encounter Medications as of 10/05/2020  Medication Sig  . isosorbide mononitrate (IMDUR) 30 MG 24 hr tablet Take 30 mg by mouth daily.  Marland Kitchen lovastatin (MEVACOR) 40 MG tablet Take 40 mg by mouth daily at 6 (six) AM.  . metoprolol succinate (TOPROL XL) 25 MG 24 hr tablet Take 1 tablet (25 mg total) by mouth 2 (two) times daily.  . naproxen sodium (ALEVE) 220 MG tablet Take 220 mg by mouth daily at 6 (six) AM.  . nitroGLYCERIN (NITROSTAT) 0.4 MG SL tablet Place 0.4 mg under the tongue every 5 (five) minutes as needed.   . pantoprazole (PROTONIX) 20 MG tablet TAKE 1 TABLET DAILY  . telmisartan (MICARDIS) 40 MG tablet Take 1 tablet (40 mg total) by mouth daily.   No facility-administered encounter medications on file as of 10/05/2020.    Allergies (verified) Patient has no known allergies.   History: Past Medical History:  Diagnosis Date  . Basal cell carcinoma 02/11/2009   Right medial brow. Excised: 02/23/2010 (recurrent)  . Basal cell carcinoma 05/01/2016   Left nasal tip. Nodular pattern.  . Basal cell carcinoma 08/25/2020   R prox forearm near the elbow - ED&C   . Bone spur   . GERD (gastroesophageal reflux disease)   . Hx of basal cell carcinoma 2007   multiple sites  . Hx of dysplastic nevus 05/28/2002   R-4 medial toe  . Hx of malignant melanoma 1991   L post. auricular. WLE  performed in [place]  . Hx of squamous cell carcinoma of skin 2012   multiple sites  . Hyperlipidemia   . Hypertension   . Squamous cell carcinoma of skin 01/06/2019   Left sideburn, preauricular area. [facility]   Past Surgical History:  Procedure Laterality Date  . BACK SURGERY    . CATARACT EXTRACTION Bilateral   . CYST REMOVAL NECK    . MELANOMA EXCISION Left 1991   post. auricular   Family History   Problem Relation Age of Onset  . Congestive Heart Failure Mother   . Dementia Father   . Other Sister        Suicide by gunshot wound   Social History   Socioeconomic History  . Marital status: Married    Spouse name: Not on file  . Number of children: 2  . Years of education: Not on file  . Highest education level: Bachelor's degree (e.g., BA, AB, BS)  Occupational History  . Occupation: retired  Tobacco Use  . Smoking status: Former Smoker    Packs/day: 1.00    Years: 20.00    Pack years: 20.00    Types: Cigarettes    Quit date: 08/21/1963    Years since quitting: 57.1  . Smokeless tobacco: Never Used  Vaping Use  . Vaping Use: Never used  Substance and Sexual Activity  . Alcohol use: Yes    Alcohol/week: 0.0 - 12.0 standard drinks    Comment: 1 beer , wine or scotch before dinner (3-4 nights a week)  . Drug use: No  . Sexual activity: Not on file  Other Topics Concern  . Not on file  Social History Narrative  . Not on file   Social Determinants of Health   Financial Resource Strain: Low Risk   . Difficulty of Paying Living Expenses: Not hard at all  Food Insecurity: No Food Insecurity  . Worried About Charity fundraiser in the Last Year: Never true  . Ran Out of Food in the Last Year: Never true  Transportation Needs: No Transportation Needs  . Lack of Transportation (Medical): No  . Lack of Transportation (Non-Medical): No  Physical Activity: Inactive  . Days of Exercise per Week: 0 days  . Minutes of Exercise per Session: 0 min  Stress: No Stress Concern Present  . Feeling of Stress : Not at all  Social Connections: Moderately Isolated  . Frequency of Communication with Friends and Family: More than three times a week  . Frequency of Social Gatherings with Friends and Family: More than three times a week  . Attends Religious Services: Never  . Active Member of Clubs or Organizations: No  . Attends Archivist Meetings: Never  . Marital  Status: Married    Tobacco Counseling Counseling given: Not Answered   Clinical Intake:  Pre-visit preparation completed: Yes  Pain : No/denies pain     Nutritional Risks: None Diabetes: No  How often do you need to have someone help you when you read instructions, pamphlets, or other written materials from your doctor or pharmacy?: 1 - Never  Diabetic? No  Interpreter Needed?: No  Information entered by :: [facility], LPN   Activities of Daily Living In your present state of health, do you have any difficulty performing the following activities: 10/05/2020  Hearing? N  Vision? N  Difficulty concentrating or making decisions? N  Walking or climbing stairs? N  Dressing or bathing? N  Doing errands, shopping? N  Conservation officer, nature and  eating ? N  Using the Toilet? N  In the past six months, have you accidently leaked urine? N  Do you have problems with loss of bowel control? N  Managing your Medications? N  Managing your Finances? N  Housekeeping or managing your Housekeeping? N  Some recent data might be hidden    Patient Care Team: Jerrol Banana., MD as PCP - General (Family Medicine) Ralene Bathe, MD as Consulting Physician (Dermatology) Corey Skains, MD as Consulting Physician (Cardiology) Leandrew Koyanagi, MD as Referring Physician (Ophthalmology) Tamsen Meek, MD as Referring Physician (Dermatology)  Indicate any recent Medical Services you may have received from other than [facility] providers in the past year (date may be approximate).     Assessment:   This is a routine wellness examination for [place].  Hearing/Vision screen No exam data present  Dietary issues and exercise activities discussed: Current Exercise Habits: The patient does not participate in regular exercise at present, Exercise limited by: None identified  Goals    . Increase water intake     Recommend increasing water intake to 6 glasses of water a day.        Depression Screen PHQ 2/9 Scores 10/05/2020 09/30/2019 03/18/2018 03/16/2017 03/15/2016  PHQ - 2 Score 0 0 0 0 0    Fall Risk Fall Risk  10/05/2020 09/30/2019 07/15/2019 03/18/2018 03/16/2017  Falls in the past year? 0 0 0 No No  Comment - - Emmi Telephone Survey: data to providers prior to load - -  Number falls in past yr: 0 0 - - -  Injury with Fall? 0 0 - - -    FALL RISK PREVENTION PERTAINING TO THE HOME:  Any stairs in or around the home? Yes  If so, are there any without handrails? No  Home free of loose throw rugs in walkways, pet beds, electrical cords, etc? Yes  Adequate lighting in your home to reduce risk of falls? Yes   ASSISTIVE DEVICES UTILIZED TO PREVENT FALLS:  Life alert? No  Use of a cane, walker or w/c? No  Grab bars in the bathroom? No  Shower chair or bench in shower? Yes  Elevated toilet seat or a handicapped toilet? No    Cognitive Function: Normal cognitive status assessed by observation by this Nurse Health Advisor. No abnormalities found.       6CIT Screen 03/18/2018 03/16/2017  What Year? 0 points 0 points  What month? 0 points 0 points  What time? 0 points 0 points  Count back from 20 0 points 0 points  Months in reverse 0 points 0 points  Repeat phrase 0 points 4 points  Total Score 0 4    Immunizations Immunization History  Administered Date(s) Administered  . Fluad Quad(high Dose 65+) 05/10/2020  . Influenza, High Dose Seasonal PF 06/18/2018, 04/26/2019  . PFIZER(Purple Top)SARS-COV-2 Vaccination 09/12/2019, 09/22/2019, 05/17/2020  . Pneumococcal Conjugate-13 02/04/2014  . Pneumococcal Polysaccharide-23 06/23/2005  . Tdap 07/31/2013  . Zoster 06/19/2006    TDAP status: Up to date  Flu Vaccine status: Up to date  Pneumococcal vaccine status: Up to date  Covid-19 vaccine status: Completed vaccines  Qualifies for Shingles Vaccine? Yes   Zostavax completed Yes   Shingrix Completed?: No.    Education has been provided regarding the  importance of this vaccine. Patient has been advised to call insurance company to determine out of pocket expense if they have not yet received this vaccine. Advised may also receive vaccine at  local pharmacy or Health Dept. Verbalized acceptance and understanding.  Screening Tests Health Maintenance  Topic Date Due  . COVID-19 Vaccine (4 - Booster) 11/14/2020  . TETANUS/TDAP  08/01/2023  . INFLUENZA VACCINE  Completed  . PNA vac Low Risk Adult  Completed    Health Maintenance  There are no preventive care reminders to display for this patient.  Colorectal cancer screening: No longer required.   Lung Cancer Screening: (Low Dose CT Chest recommended if Age 20-80 years, 30 pack-year currently smoking OR have quit w/in 15years.) does not qualify.   Additional Screening:  Vision Screening: Recommended annual ophthalmology exams for early detection of glaucoma and other disorders of the eye. Is the patient up to date with their annual eye exam?  Yes  Who is the provider or what is the name of the office in which the patient attends annual eye exams? Dr Wallace Going @ [facility] If pt is not established with a provider, would they like to be referred to a provider to establish care? No .   Dental Screening: Recommended annual dental exams for proper oral hygiene  Community Resource Referral / Chronic Care Management: CRR required this visit?  No   CCM required this visit?  No      Plan:     I have personally reviewed and noted the following in the patient's chart:   . Medical and social history . Use of alcohol, tobacco or illicit drugs  . Current medications and supplements . Functional ability and status . Nutritional status . Physical activity . Advanced directives . List of other physicians . Hospitalizations, surgeries, and ER visits in previous 12 months . Vitals . Screenings to include cognitive, depression, and falls . Referrals and appointments  In addition, I have  reviewed and discussed with patient certain preventive protocols, quality metrics, and best practice recommendations. A written personalized care plan for preventive services as well as general preventive health recommendations were provided to patient.     Abdirahim Flavell Riley, [place]   [id number]   Nurse Notes: None.

## 2020-10-05 ENCOUNTER — Other Ambulatory Visit: Payer: Self-pay

## 2020-10-05 ENCOUNTER — Ambulatory Visit (INDEPENDENT_AMBULATORY_CARE_PROVIDER_SITE_OTHER): Payer: Medicare Other

## 2020-10-05 DIAGNOSIS — Z0000 Encounter for general adult medical examination without abnormal findings: Secondary | ICD-10-CM | POA: Diagnosis not present

## 2020-10-05 NOTE — Patient Instructions (Signed)
Adam Hanna , Thank you for taking time to come for your Medicare Wellness Visit. I appreciate your ongoing commitment to your health goals. Please review the following plan we discussed and let me know if I can assist you in the future.   Screening recommendations/referrals: Colonoscopy: No longer required.  Recommended yearly ophthalmology/optometry visit for glaucoma screening and checkup Recommended yearly dental visit for hygiene and checkup  Vaccinations: Influenza vaccine: Done 05/10/20 Pneumococcal vaccine: Completed series Tdap vaccine: Up to date, due 07/2023 Shingles vaccine: Shingrix discussed. Please contact your pharmacy for coverage information.     Advanced directives: Currently on file.   Conditions/risks identified: Recommend to continue to increase water intake to 6-8 8 oz glasses a day.   Next appointment: 10/06/20 @ 3:20 PM with Dr Rosanna Randy   Preventive Care 47 Years and Older, Male Preventive care refers to lifestyle choices and visits with your health care provider that can promote health and wellness. What does preventive care include?  A yearly physical exam. This is also called an annual well check.  Dental exams once or twice a year.  Routine eye exams. Ask your health care provider how often you should have your eyes checked.  Personal lifestyle choices, including:  Daily care of your teeth and gums.  Regular physical activity.  Eating a healthy diet.  Avoiding tobacco and drug use.  Limiting alcohol use.  Practicing safe sex.  Taking low doses of aspirin every day.  Taking vitamin and mineral supplements as recommended by your health care provider. What happens during an annual well check? The services and screenings done by your health care provider during your annual well check will depend on your age, overall health, lifestyle risk factors, and family history of disease. Counseling  Your health care provider may ask you questions about  your:  Alcohol use.  Tobacco use.  Drug use.  Emotional well-being.  Home and relationship well-being.  Sexual activity.  Eating habits.  History of falls.  Memory and ability to understand (cognition).  Work and work Statistician. Screening  You may have the following tests or measurements:  Height, weight, and BMI.  Blood pressure.  Lipid and cholesterol levels. These may be checked every 5 years, or more frequently if you are over 14 years old.  Skin check.  Lung cancer screening. You may have this screening every year starting at age 4 if you have a 30-pack-year history of smoking and currently smoke or have quit within the past 15 years.  Fecal occult blood test (FOBT) of the stool. You may have this test every year starting at age 75.  Flexible sigmoidoscopy or colonoscopy. You may have a sigmoidoscopy every 5 years or a colonoscopy every 10 years starting at age 75.  Prostate cancer screening. Recommendations will vary depending on your family history and other risks.  Hepatitis C blood test.  Hepatitis B blood test.  Sexually transmitted disease (STD) testing.  Diabetes screening. This is done by checking your blood sugar (glucose) after you have not eaten for a while (fasting). You may have this done every 1-3 years.  Abdominal aortic aneurysm (AAA) screening. You may need this if you are a current or former smoker.  Osteoporosis. You may be screened starting at age 77 if you are at high risk. Talk with your health care provider about your test results, treatment options, and if necessary, the need for more tests. Vaccines  Your health care provider may recommend certain vaccines, such as:  Influenza  vaccine. This is recommended every year.  Tetanus, diphtheria, and acellular pertussis (Tdap, Td) vaccine. You may need a Td booster every 10 years.  Zoster vaccine. You may need this after age 25.  Pneumococcal 13-valent conjugate (PCV13) vaccine.  One dose is recommended after age 76.  Pneumococcal polysaccharide (PPSV23) vaccine. One dose is recommended after age 43. Talk to your health care provider about which screenings and vaccines you need and how often you need them. This information is not intended to replace advice given to you by your health care provider. Make sure you discuss any questions you have with your health care provider. Document Released: 09/03/2015 Document Revised: 04/26/2016 Document Reviewed: 06/08/2015 Elsevier Interactive Patient Education  2017 [place] Prevention in the Home Falls can cause injuries. They can happen to people of all ages. There are many things you can do to make your home safe and to help prevent falls. What can I do on the outside of my home?  Regularly fix the edges of walkways and driveways and fix any cracks.  Remove anything that might make you trip as you walk through a door, such as a raised step or threshold.  Trim any bushes or trees on the path to your home.  Use bright outdoor lighting.  Clear any walking paths of anything that might make someone trip, such as rocks or tools.  Regularly check to see if handrails are loose or broken. Make sure that both sides of any steps have handrails.  Any raised decks and porches should have guardrails on the edges.  Have any leaves, snow, or ice cleared regularly.  Use sand or salt on walking paths during winter.  Clean up any spills in your garage right away. This includes oil or grease spills. What can I do in the bathroom?  Use night lights.  Install grab bars by the toilet and in the tub and shower. Do not use towel bars as grab bars.  Use non-skid mats or decals in the tub or shower.  If you need to sit down in the shower, use a plastic, non-slip stool.  Keep the floor dry. Clean up any water that spills on the floor as soon as it happens.  Remove soap buildup in the tub or shower regularly.  Attach bath  mats securely with double-sided non-slip rug tape.  Do not have throw rugs and other things on the floor that can make you trip. What can I do in the bedroom?  Use night lights.  Make sure that you have a light by your bed that is easy to reach.  Do not use any sheets or blankets that are too big for your bed. They should not hang down onto the floor.  Have a firm chair that has side arms. You can use this for support while you get dressed.  Do not have throw rugs and other things on the floor that can make you trip. What can I do in the kitchen?  Clean up any spills right away.  Avoid walking on wet floors.  Keep items that you use a lot in easy-to-reach places.  If you need to reach something above you, use a strong step stool that has a grab bar.  Keep electrical cords out of the way.  Do not use floor polish or wax that makes floors slippery. If you must use wax, use non-skid floor wax.  Do not have throw rugs and other things on the floor that can  make you trip. What can I do with my stairs?  Do not leave any items on the stairs.  Make sure that there are handrails on both sides of the stairs and use them. Fix handrails that are broken or loose. Make sure that handrails are as long as the stairways.  Check any carpeting to make sure that it is firmly attached to the stairs. Fix any carpet that is loose or worn.  Avoid having throw rugs at the top or bottom of the stairs. If you do have throw rugs, attach them to the floor with carpet tape.  Make sure that you have a light switch at the top of the stairs and the bottom of the stairs. If you do not have them, ask someone to add them for you. What else can I do to help prevent falls?  Wear shoes that:  Do not have high heels.  Have rubber bottoms.  Are comfortable and fit you well.  Are closed at the toe. Do not wear sandals.  If you use a stepladder:  Make sure that it is fully opened. Do not climb a closed  stepladder.  Make sure that both sides of the stepladder are locked into place.  Ask someone to hold it for you, if possible.  Clearly mark and make sure that you can see:  Any grab bars or handrails.  First and last steps.  Where the edge of each step is.  Use tools that help you move around (mobility aids) if they are needed. These include:  Canes.  Walkers.  Scooters.  Crutches.  Turn on the lights when you go into a dark area. Replace any light bulbs as soon as they burn out.  Set up your furniture so you have a clear path. Avoid moving your furniture around.  If any of your floors are uneven, fix them.  If there are any pets around you, be aware of where they are.  Review your medicines with your doctor. Some medicines can make you feel dizzy. This can increase your chance of falling. Ask your doctor what other things that you can do to help prevent falls. This information is not intended to replace advice given to you by your health care provider. Make sure you discuss any questions you have with your health care provider. Document Released: 06/03/2009 Document Revised: 01/13/2016 Document Reviewed: 09/11/2014 Elsevier Interactive Patient Education  2017 [employer].

## 2020-10-06 ENCOUNTER — Other Ambulatory Visit: Payer: Self-pay

## 2020-10-06 ENCOUNTER — Ambulatory Visit (INDEPENDENT_AMBULATORY_CARE_PROVIDER_SITE_OTHER): Payer: Medicare Other | Admitting: Family Medicine

## 2020-10-06 ENCOUNTER — Encounter: Payer: Self-pay | Admitting: Family Medicine

## 2020-10-06 VITALS — BP 122/63 | HR 60 | Temp 98.2°F | Resp 16 | Wt 213.0 lb

## 2020-10-06 DIAGNOSIS — I509 Heart failure, unspecified: Secondary | ICD-10-CM | POA: Diagnosis not present

## 2020-10-06 DIAGNOSIS — I10 Essential (primary) hypertension: Secondary | ICD-10-CM

## 2020-10-06 DIAGNOSIS — E785 Hyperlipidemia, unspecified: Secondary | ICD-10-CM

## 2020-10-06 DIAGNOSIS — R739 Hyperglycemia, unspecified: Secondary | ICD-10-CM | POA: Diagnosis not present

## 2020-10-06 NOTE — Progress Notes (Signed)
I,Johnathon Mittal,acting as a scribe for Wilhemena Durie, MD.,have documented all relevant documentation on the behalf of Wilhemena Durie, MD,as directed by  Wilhemena Durie, MD while in the presence of Wilhemena Durie, MD.   Established patient visit   Patient: Adam Hanna.   DOB: 07-Sep-1925   87 y.o. Male  MRN: [id number] Visit Date: 10/06/2020  Today's healthcare provider: Wilhemena Durie, MD   No chief complaint on file.  Subjective    HPI  Patient feels well. He did loses sense of taste and smell 2 to 3 months ago but he associates this with Mohs surgery that he had. His only complaint today is 1 of rhinorrhea that he has each morning for about 2 hours.  That is it.  Otherwise he feels well. Hypertension, follow-up  BP Readings from Last 3 Encounters:  10/06/20 122/63  12/08/19 125/74  09/22/19 (!) 185/74   Wt Readings from Last 3 Encounters:  10/06/20 213 lb (96.6 kg)  12/08/19 207 lb (93.9 kg)  09/22/19 210 lb 12.8 oz (95.6 kg)     He was last seen for hypertension 10 months ago.  BP at that visit was 125/74. Management since that visit includes; well controlled on Imdur and Micardis.  He reports good compliance with treatment. He is not having side effects. None He is not exercising. He is adherent to low salt diet.   Outside blood pressures are not checking.  He does not smoke.  Use of agents associated with hypertension: none.   --------------------------------------------------------------------  Lipid/Cholesterol, follow-up  Last Lipid Panel: Lab Results  Component Value Date   CHOL 247 (H) 09/22/2019   LDLCALC 176 (H) 09/22/2019   HDL 44 09/22/2019   TRIG 146 09/22/2019    He was last seen for this 1 years ago.  Management since that visit includes; on lovastatin.  He reports good compliance with treatment. He is not having side effects. none He is following a Regular, Low Sodium diet. Current exercise: none  Last  metabolic panel Lab Results  Component Value Date   GLUCOSE 97 09/22/2019   NA 142 09/22/2019   K 4.9 09/22/2019   BUN 17 09/22/2019   CREATININE 1.13 09/22/2019   GFRNONAA 57 (L) 09/22/2019   GFRAA 65 09/22/2019   CALCIUM 9.4 09/22/2019   AST 25 09/22/2019   ALT 21 09/22/2019   The ASCVD Risk score Mikey Bussing DC Jr., et al., 2013) failed to calculate for the following reasons:   The 2013 ASCVD risk score is only valid for ages 45 to 9   The patient has a prior MI or stroke diagnosis  --------------------------------------------------------------------       Medications: Outpatient Medications Prior to Visit  Medication Sig  . isosorbide mononitrate (IMDUR) 30 MG 24 hr tablet Take 30 mg by mouth daily.  Marland Kitchen lovastatin (MEVACOR) 40 MG tablet Take 40 mg by mouth daily at 6 (six) AM.  . metoprolol succinate (TOPROL XL) 25 MG 24 hr tablet Take 1 tablet (25 mg total) by mouth 2 (two) times daily.  . naproxen sodium (ALEVE) 220 MG tablet Take 220 mg by mouth daily at 6 (six) AM.  . nitroGLYCERIN (NITROSTAT) 0.4 MG SL tablet Place 0.4 mg under the tongue every 5 (five) minutes as needed.   . pantoprazole (PROTONIX) 20 MG tablet TAKE 1 TABLET DAILY  . telmisartan (MICARDIS) 40 MG tablet Take 1 tablet (40 mg total) by mouth daily.   No facility-administered medications prior  to visit.    Review of Systems  Constitutional: Negative for appetite change, chills and fever.  Respiratory: Negative for chest tightness, shortness of breath and wheezing.   Cardiovascular: Negative for chest pain and palpitations.  Gastrointestinal: Negative for abdominal pain, nausea and vomiting.        Objective    BP 122/63 (BP Location: Left Arm, Patient Position: Sitting, Cuff Size: Large)   Pulse 60   Temp 98.2 F (36.8 C) (Oral)   Resp 16   Wt 213 lb (96.6 kg)   SpO2 95%   BMI 28.10 kg/m  BP Readings from Last 3 Encounters:  10/06/20 122/63  12/08/19 125/74  09/22/19 (!) 185/74   Wt  Readings from Last 3 Encounters:  10/06/20 213 lb (96.6 kg)  12/08/19 207 lb (93.9 kg)  09/22/19 210 lb 12.8 oz (95.6 kg)       Physical Exam Vitals reviewed.  Constitutional:      General: He is not in acute distress.    Appearance: Normal appearance. He is well-developed. He is not diaphoretic.  HENT:     Head: Normocephalic and atraumatic.     Right Ear: External ear normal.     Left Ear: External ear normal.     Nose: Nose normal.  Eyes:     General: No scleral icterus.    Conjunctiva/sclera: Conjunctivae normal.  Neck:     Thyroid: No thyromegaly.  Cardiovascular:     Rate and Rhythm: Normal rate and regular rhythm.     Heart sounds: Normal heart sounds. No murmur heard.   Pulmonary:     Effort: Pulmonary effort is normal. No respiratory distress.     Breath sounds: Normal breath sounds. No stridor. No rales.  Abdominal:     Palpations: Abdomen is soft.  Musculoskeletal:     Cervical back: Neck supple.  Skin:    General: Skin is warm and dry.  Neurological:     Mental Status: He is alert and oriented to person, place, and time.  Psychiatric:        Mood and Affect: Mood normal.        Behavior: Behavior normal.        Thought Content: Thought content normal.        Judgment: Judgment normal.       No results found for any visits on 10/06/20.  Assessment & Plan     1. Essential hypertension Well-controlled.  On metoprolol and telmisartan - Lipid panel - TSH - CBC w/Diff/Platelet - Comprehensive Metabolic Panel (CMET) - Hemoglobin A1c  2. Dyslipidemia On lovastatin.  Goal LDL less than 70 - Lipid panel - TSH - CBC w/Diff/Platelet - Comprehensive Metabolic Panel (CMET) - Hemoglobin A1c  3. Hyperglycemia  - Lipid panel - TSH - CBC w/Diff/Platelet - Comprehensive Metabolic Panel (CMET) - Hemoglobin A1c  4. CAD All risk factors treated. - Lipid panel - TSH - CBC w/Diff/Platelet - Comprehensive Metabolic Panel (CMET) - Hemoglobin A1c 5.   Rhinorrhea   Return in about 6 months (around 04/05/2021).       Cranford Mon, MD  [facility] [phone] (phone) [phone] (fax)  [place]

## 2020-10-07 DIAGNOSIS — I10 Essential (primary) hypertension: Secondary | ICD-10-CM | POA: Diagnosis not present

## 2020-10-07 DIAGNOSIS — E785 Hyperlipidemia, unspecified: Secondary | ICD-10-CM | POA: Diagnosis not present

## 2020-10-07 DIAGNOSIS — C44311 Basal cell carcinoma of skin of nose: Secondary | ICD-10-CM | POA: Diagnosis not present

## 2020-10-07 DIAGNOSIS — R739 Hyperglycemia, unspecified: Secondary | ICD-10-CM | POA: Diagnosis not present

## 2020-10-08 LAB — CBC WITH DIFFERENTIAL/PLATELET
Basophils Absolute: 0.1 10*3/uL (ref 0.0–0.2)
Basos: 1 %
EOS (ABSOLUTE): 0.2 10*3/uL (ref 0.0–0.4)
Eos: 5 %
Hematocrit: 41.2 % (ref 37.5–51.0)
Hemoglobin: 13.6 g/dL (ref 13.0–17.7)
Immature Grans (Abs): 0 10*3/uL (ref 0.0–0.1)
Immature Granulocytes: 0 %
Lymphocytes Absolute: 1.3 10*3/uL (ref 0.7–3.1)
Lymphs: 27 %
MCH: 29.3 pg (ref 26.6–33.0)
MCHC: 33 g/dL (ref 31.5–35.7)
MCV: 89 fL (ref 79–97)
Monocytes Absolute: 0.4 10*3/uL (ref 0.1–0.9)
Monocytes: 9 %
Neutrophils Absolute: 2.8 10*3/uL (ref 1.4–7.0)
Neutrophils: 58 %
Platelets: 216 10*3/uL (ref 150–450)
RBC: 4.64 x10E6/uL (ref 4.14–5.80)
RDW: 13.3 % (ref 11.6–15.4)
WBC: 4.8 10*3/uL (ref 3.4–10.8)

## 2020-10-08 LAB — COMPREHENSIVE METABOLIC PANEL
ALT: 22 IU/L (ref 0–44)
AST: 29 IU/L (ref 0–40)
Albumin/Globulin Ratio: 1.8 (ref 1.2–2.2)
Albumin: 4.4 g/dL (ref 3.5–4.6)
Alkaline Phosphatase: 108 IU/L (ref 44–121)
BUN/Creatinine Ratio: 18 (ref 10–24)
BUN: 22 mg/dL (ref 10–36)
Bilirubin Total: 0.6 mg/dL (ref 0.0–1.2)
CO2: 22 mmol/L (ref 20–29)
Calcium: 8.7 mg/dL (ref 8.6–10.2)
Chloride: 104 mmol/L (ref 96–106)
Creatinine, Ser: 1.23 mg/dL (ref 0.76–1.27)
GFR calc Af Amer: 59 mL/min/{1.73_m2} — ABNORMAL LOW (ref >59)
GFR calc non Af Amer: 51 mL/min/{1.73_m2} — ABNORMAL LOW (ref >59)
Globulin, Total: 2.4 g/dL (ref 1.5–4.5)
Glucose: 91 mg/dL (ref 65–99)
Potassium: 4.6 mmol/L (ref 3.5–5.2)
Sodium: 141 mmol/L (ref 134–144)
Total Protein: 6.8 g/dL (ref 6.0–8.5)

## 2020-10-08 LAB — LIPID PANEL
Chol/HDL Ratio: 4.1 ratio (ref 0.0–5.0)
Cholesterol, Total: 190 mg/dL (ref 100–199)
HDL: 46 mg/dL (ref >39)
LDL Chol Calc (NIH): 123 mg/dL — ABNORMAL HIGH (ref 0–99)
Triglycerides: 114 mg/dL (ref 0–149)
VLDL Cholesterol Cal: 21 mg/dL (ref 5–40)

## 2020-10-08 LAB — HEMOGLOBIN A1C
Est. average glucose Bld gHb Est-mCnc: 120 mg/dL
Hgb A1c MFr Bld: 5.8 % — ABNORMAL HIGH (ref 4.8–5.6)

## 2020-10-08 LAB — TSH: TSH: 3.56 u[IU]/mL (ref 0.450–4.500)

## 2020-12-05 ENCOUNTER — Other Ambulatory Visit: Payer: Self-pay | Admitting: Family Medicine

## 2020-12-05 DIAGNOSIS — I10 Essential (primary) hypertension: Secondary | ICD-10-CM

## 2020-12-05 NOTE — Telephone Encounter (Signed)
Requested medication has expired on 11/11/20. Last RF 11/12/19 #180 3 RF  Routing to office.

## 2021-02-07 ENCOUNTER — Other Ambulatory Visit: Payer: Self-pay | Admitting: Family Medicine

## 2021-02-07 NOTE — Telephone Encounter (Signed)
Requested Prescriptions  Pending Prescriptions Disp Refills  . pantoprazole (PROTONIX) 20 MG tablet [Pharmacy Med Name: PANTOPRAZOLE SODIUM DR TABS 20MG ] 90 tablet 0    Sig: TAKE 1 TABLET DAILY     Gastroenterology: Proton Pump Inhibitors Passed - 02/07/2021  5:00 PM      Passed - Valid encounter within last 12 months    Recent Outpatient Visits          4 months ago Essential hypertension   [facility] Jerrol Banana., MD   1 year ago Essential hypertension   [facility] Jerrol Banana., MD   1 year ago Essential hypertension   [facility] Jerrol Banana., MD   1 year ago Weakness of left lower extremity   [facility] Jerrol Banana., MD   2 years ago Essential hypertension   [facility] Jerrol Banana., MD      Future Appointments            In 1 month Jerrol Banana., MD [facility], [place]

## 2021-02-09 ENCOUNTER — Ambulatory Visit (INDEPENDENT_AMBULATORY_CARE_PROVIDER_SITE_OTHER): Payer: Medicare Other | Admitting: Dermatology

## 2021-02-09 ENCOUNTER — Other Ambulatory Visit: Payer: Self-pay

## 2021-02-09 DIAGNOSIS — Z1283 Encounter for screening for malignant neoplasm of skin: Secondary | ICD-10-CM | POA: Diagnosis not present

## 2021-02-09 DIAGNOSIS — D229 Melanocytic nevi, unspecified: Secondary | ICD-10-CM | POA: Diagnosis not present

## 2021-02-09 DIAGNOSIS — L814 Other melanin hyperpigmentation: Secondary | ICD-10-CM

## 2021-02-09 DIAGNOSIS — L570 Actinic keratosis: Secondary | ICD-10-CM | POA: Diagnosis not present

## 2021-02-09 DIAGNOSIS — Z85820 Personal history of malignant melanoma of skin: Secondary | ICD-10-CM | POA: Diagnosis not present

## 2021-02-09 DIAGNOSIS — Z85828 Personal history of other malignant neoplasm of skin: Secondary | ICD-10-CM

## 2021-02-09 DIAGNOSIS — Z86018 Personal history of other benign neoplasm: Secondary | ICD-10-CM

## 2021-02-09 DIAGNOSIS — Q825 Congenital non-neoplastic nevus: Secondary | ICD-10-CM

## 2021-02-09 DIAGNOSIS — L820 Inflamed seborrheic keratosis: Secondary | ICD-10-CM

## 2021-02-09 DIAGNOSIS — L578 Other skin changes due to chronic exposure to nonionizing radiation: Secondary | ICD-10-CM | POA: Diagnosis not present

## 2021-02-09 DIAGNOSIS — D1800 Hemangioma unspecified site: Secondary | ICD-10-CM

## 2021-02-09 DIAGNOSIS — L821 Other seborrheic keratosis: Secondary | ICD-10-CM | POA: Diagnosis not present

## 2021-02-09 NOTE — Patient Instructions (Signed)

## 2021-02-09 NOTE — Progress Notes (Signed)
Follow-Up Visit   Subjective  Adam Hanna. is a 87 y.o. male who presents for the following: Annual Exam (History of SCC, BCC and Dysplastic nevi - TBSE today) The patient presents for Total-Body Skin Exam (TBSE) for skin cancer screening and mole check.  The following portions of the chart were reviewed this encounter and updated as appropriate:   Tobacco  Allergies  Meds  Problems  Med Hx  Surg Hx  Fam Hx      Review of Systems:  No other skin or systemic complaints except as noted in HPI or Assessment and Plan.  Objective  Well appearing patient in no apparent distress; mood and affect are within normal limits.  A full examination was performed including scalp, head, eyes, ears, nose, lips, neck, chest, axillae, abdomen, back, buttocks, bilateral upper extremities, bilateral lower extremities, hands, feet, fingers, toes, fingernails, and toenails. All findings within normal limits unless otherwise noted below.  Scalp, ears, face (7) Erythematous thin papules/macules with gritty scale.   Right post flank Brown plaque     Neck (7) Erythematous keratotic or waxy stuck-on papule or plaque.    Assessment & Plan   History of Melanoma - No evidence of recurrence today - No lymphadenopathy - Recommend regular full body skin exams - Recommend daily broad spectrum sunscreen SPF 30+ to sun-exposed areas, reapply every 2 hours as needed.  - Call if any new or changing lesions are noted between office visits  History of Squamous Cell Carcinoma of the Skin - No evidence of recurrence today - No lymphadenopathy - Recommend regular full body skin exams - Recommend daily broad spectrum sunscreen SPF 30+ to sun-exposed areas, reapply every 2 hours as needed.  - Call if any new or changing lesions are noted between office visits  History of Basal Cell Carcinoma of the Skin - No evidence of recurrence today - Recommend regular full body skin exams - Recommend daily broad  spectrum sunscreen SPF 30+ to sun-exposed areas, reapply every 2 hours as needed.  - Call if any new or changing lesions are noted between office visits  History of Dysplastic Nevi - No evidence of recurrence today - Recommend regular full body skin exams - Recommend daily broad spectrum sunscreen SPF 30+ to sun-exposed areas, reapply every 2 hours as needed.  - Call if any new or changing lesions are noted between office visits  Lentigines - Scattered tan macules - Due to sun exposure - Benign-appering, observe - Recommend daily broad spectrum sunscreen SPF 30+ to sun-exposed areas, reapply every 2 hours as needed. - Call for any changes  Seborrheic Keratoses - Stuck-on, waxy, tan-brown papules and/or plaques  - Benign-appearing - Discussed benign etiology and prognosis. - Observe - Call for any changes  Melanocytic Nevi - Tan-brown and/or pink-flesh-colored symmetric macules and papules - Benign appearing on exam today - Observation - Call clinic for new or changing moles - Recommend daily use of broad spectrum spf 30+ sunscreen to sun-exposed areas.   Hemangiomas - Red papules - Discussed benign nature - Observe - Call for any changes  Actinic Damage - Chronic condition, secondary to cumulative UV/sun exposure - diffuse scaly erythematous macules with underlying dyspigmentation - Recommend daily broad spectrum sunscreen SPF 30+ to sun-exposed areas, reapply every 2 hours as needed.  - Staying in the shade or wearing long sleeves, sun glasses (UVA+UVB protection) and wide brim hats (4-inch brim around the entire circumference of the hat) are also recommended for sun protection.  -  Call for new or changing lesions.  Skin cancer screening performed today.  AK (actinic keratosis) (7) Scalp, ears, face  Destruction of lesion - Scalp, ears, face Complexity: simple   Destruction method: cryotherapy   Informed consent: discussed and consent obtained   Timeout:  patient  name, date of birth, surgical site, and procedure verified Lesion destroyed using liquid nitrogen: Yes   Region frozen until ice ball extended beyond lesion: Yes   Outcome: patient tolerated procedure well with no complications   Post-procedure details: wound care instructions given    Congenital non-neoplastic nevus Right post flank  Benign appearing. Observe.  Inflamed seborrheic keratosis Neck x 7  Destruction of lesion - Neck Complexity: simple   Destruction method: cryotherapy   Informed consent: discussed and consent obtained   Timeout:  patient name, date of birth, surgical site, and procedure verified Lesion destroyed using liquid nitrogen: Yes   Region frozen until ice ball extended beyond lesion: Yes   Outcome: patient tolerated procedure well with no complications   Post-procedure details: wound care instructions given    Skin cancer screening  Return in about 9 months (around 11/09/2021).  I, Ashok Cordia, CMA, am acting as scribe for Sarina Ser, MD .  Documentation: I have reviewed the above documentation for accuracy and completeness, and I agree with the above.  Sarina Ser, MD

## 2021-02-15 ENCOUNTER — Encounter: Payer: Self-pay | Admitting: Dermatology

## 2021-04-04 DIAGNOSIS — Z20822 Contact with and (suspected) exposure to covid-19: Secondary | ICD-10-CM | POA: Diagnosis not present

## 2021-04-04 DIAGNOSIS — R430 Anosmia: Secondary | ICD-10-CM | POA: Diagnosis not present

## 2021-04-04 DIAGNOSIS — J3489 Other specified disorders of nose and nasal sinuses: Secondary | ICD-10-CM | POA: Diagnosis not present

## 2021-04-05 ENCOUNTER — Ambulatory Visit (INDEPENDENT_AMBULATORY_CARE_PROVIDER_SITE_OTHER): Payer: Medicare Other | Admitting: Family Medicine

## 2021-04-05 ENCOUNTER — Other Ambulatory Visit: Payer: Self-pay

## 2021-04-05 ENCOUNTER — Encounter: Payer: Self-pay | Admitting: Family Medicine

## 2021-04-05 VITALS — BP 142/75 | HR 66 | Temp 98.5°F | Resp 16 | Ht 73.5 in | Wt 208.0 lb

## 2021-04-05 DIAGNOSIS — K219 Gastro-esophageal reflux disease without esophagitis: Secondary | ICD-10-CM

## 2021-04-05 DIAGNOSIS — I10 Essential (primary) hypertension: Secondary | ICD-10-CM

## 2021-04-05 DIAGNOSIS — I491 Atrial premature depolarization: Secondary | ICD-10-CM

## 2021-04-05 DIAGNOSIS — I208 Other forms of angina pectoris: Secondary | ICD-10-CM

## 2021-04-05 DIAGNOSIS — E785 Hyperlipidemia, unspecified: Secondary | ICD-10-CM

## 2021-04-05 NOTE — Progress Notes (Signed)
Established patient visit   Patient: Adam Hanna.   DOB: Apr 18, 1926   87 y.o. Male  MRN: [id number] Visit Date: 04/05/2021  Today's healthcare provider: Wilhemena Durie, MD   Chief Complaint  Patient presents with   Hypertension   Hyperlipidemia   Subjective  Patient will return 41 in just a few days.  His wife is 95. He is feeling fairly well and does not want to follow-up with cardiology going forward.  I will be happy to take care of his refills. Friends report that he is having some memory problems. Hypertension, follow-up  BP Readings from Last 3 Encounters:  04/05/21 (!) 142/75  10/06/20 122/63  12/08/19 125/74   Wt Readings from Last 3 Encounters:  04/05/21 208 lb (94.3 kg)  10/06/20 213 lb (96.6 kg)  12/08/19 207 lb (93.9 kg)     He was last seen for hypertension 6 months ago.  BP at that visit was 122/63. Management since that visit includes no medication changes.  He reports good compliance with treatment. He is not having side effects.  He is following a Regular diet. He is exercising. He does not smoke.  Use of agents associated with hypertension: none.   Outside blood pressures are checked occasionall. Symptoms: No chest pain No chest pressure  No palpitations No syncope  No dyspnea No orthopnea  No paroxysmal nocturnal dyspnea No lower extremity edema   Pertinent labs: Lab Results  Component Value Date   CHOL 190 10/07/2020   HDL 46 10/07/2020   LDLCALC 123 (H) 10/07/2020   TRIG 114 10/07/2020   CHOLHDL 4.1 10/07/2020   Lab Results  Component Value Date   NA 141 10/07/2020   K 4.6 10/07/2020   CREATININE 1.23 10/07/2020   GFRNONAA 51 (L) 10/07/2020   GFRAA 59 (L) 10/07/2020   GLUCOSE 91 10/07/2020     The ASCVD Risk score (Goff DC Jr., et al., 2013) failed to calculate for the following reasons:   The 2013 ASCVD risk score is only valid for ages 61 to 90   The patient has a prior MI or stroke diagnosis    --------------------------------------------------------------------------------------------------- Lipid/Cholesterol, Follow-up  Last lipid panel Other pertinent labs  Lab Results  Component Value Date   CHOL 190 10/07/2020   HDL 46 10/07/2020   LDLCALC 123 (H) 10/07/2020   TRIG 114 10/07/2020   CHOLHDL 4.1 10/07/2020   Lab Results  Component Value Date   ALT 22 10/07/2020   AST 29 10/07/2020   PLT 216 10/07/2020   TSH 3.560 10/07/2020     He was last seen for this 6 months ago.  Management since that visit includes no medication changes.  He reports good compliance with treatment. He is not having side effects.   Symptoms: No chest pain No chest pressure/discomfort  No dyspnea No lower extremity edema  No numbness or tingling of extremity No orthopnea  No palpitations No paroxysmal nocturnal dyspnea  No speech difficulty No syncope   Current diet: well balanced Current exercise: no regular exercise  The ASCVD Risk score ([place]., et al., 2013) failed to calculate for the following reasons:   The 2013 ASCVD risk score is only valid for ages 31 to 34   The patient has a prior MI or stroke diagnosis       Medications: Outpatient Medications Prior to Visit  Medication Sig   isosorbide mononitrate (IMDUR) 30 MG 24 hr tablet Take 30 mg by mouth  daily.   metoprolol succinate (TOPROL-XL) 25 MG 24 hr tablet TAKE 1 TABLET TWICE A DAY   naproxen sodium (ALEVE) 220 MG tablet Take 220 mg by mouth daily at 6 (six) AM.   nitroGLYCERIN (NITROSTAT) 0.4 MG SL tablet Place 0.4 mg under the tongue every 5 (five) minutes as needed.    pantoprazole (PROTONIX) 20 MG tablet TAKE 1 TABLET DAILY   telmisartan (MICARDIS) 40 MG tablet Take 1 tablet (40 mg total) by mouth daily.   lovastatin (MEVACOR) 40 MG tablet Take 40 mg by mouth daily at 6 (six) AM.   No facility-administered medications prior to visit.    Review of Systems  Constitutional:  Negative for activity change and  fatigue.  Respiratory:  Negative for cough and shortness of breath.   Cardiovascular:  Negative for chest pain, palpitations and leg swelling.  Musculoskeletal:  Negative for arthralgias and myalgias.  Neurological:  Negative for dizziness, light-headedness and headaches.  Psychiatric/Behavioral:  Negative for agitation, self-injury, sleep disturbance and suicidal ideas. The patient is not nervous/anxious.        Objective  -------------------------------------------------------------------------------------------------------------------- BP (!) 142/75   Pulse 66   Temp 98.5 F (36.9 C)   Resp 16   Ht 6' 1.5" (1.867 m)   Wt 208 lb (94.3 kg)   BMI 27.07 kg/m  BP Readings from Last 3 Encounters:  04/05/21 (!) 142/75  10/06/20 122/63  12/08/19 125/74   Wt Readings from Last 3 Encounters:  04/05/21 208 lb (94.3 kg)  10/06/20 213 lb (96.6 kg)  12/08/19 207 lb (93.9 kg)       Physical Exam Vitals reviewed.  Constitutional:      General: He is not in acute distress.    Appearance: Normal appearance. He is well-developed. He is not diaphoretic.  HENT:     Head: Normocephalic and atraumatic.     Right Ear: External ear normal.     Left Ear: External ear normal.     Nose: Nose normal.  Eyes:     General: No scleral icterus.    Conjunctiva/sclera: Conjunctivae normal.  Neck:     Thyroid: No thyromegaly.  Cardiovascular:     Rate and Rhythm: Normal rate and regular rhythm.     Heart sounds: Normal heart sounds. No murmur heard. Pulmonary:     Effort: Pulmonary effort is normal. No respiratory distress.     Breath sounds: Normal breath sounds. No stridor. No rales.  Abdominal:     Palpations: Abdomen is soft.  Musculoskeletal:     Cervical back: Neck supple.  Skin:    General: Skin is warm and dry.  Neurological:     Mental Status: He is alert and oriented to person, place, and time.  Psychiatric:        Mood and Affect: Mood normal.        Behavior: Behavior normal.         Thought Content: Thought content normal.        Judgment: Judgment normal.      No results found for any visits on 04/05/21.  Assessment & Plan  ----------------------------------------------------------------------------------------------------------------------  1. Essential hypertension Good control on Toprol and telmisartan  2. Primary hypertension   3. Gastroesophageal reflux disease without esophagitis On pantoprazole daily for control  4. Dyslipidemia On Mevacor 40  5. Supraventricular premature beats On Toprol and stable  6. Stable angina (HCC) On Imdur and doing well.  Risk factors are treated with antihypertensive and statin.   No follow-ups on  file.      I, Wilhemena Durie, MD, have reviewed all documentation for this visit. The documentation on 04/11/21 for the exam, diagnosis, procedures, and orders are all accurate and complete.    Jatavius Ellenwood Cranford Mon, MD  [facility] [phone] (phone) [phone] (fax)  [place]

## 2021-04-30 IMAGING — DX DG CHEST 1V PORT
1 series · 1 of 1 positions shown · non-contrast
Comparison: Radiograph 02/22/2016

CLINICAL DATA: Chest pain

EXAM:
PORTABLE CHEST 1 VIEW

[chest ap]
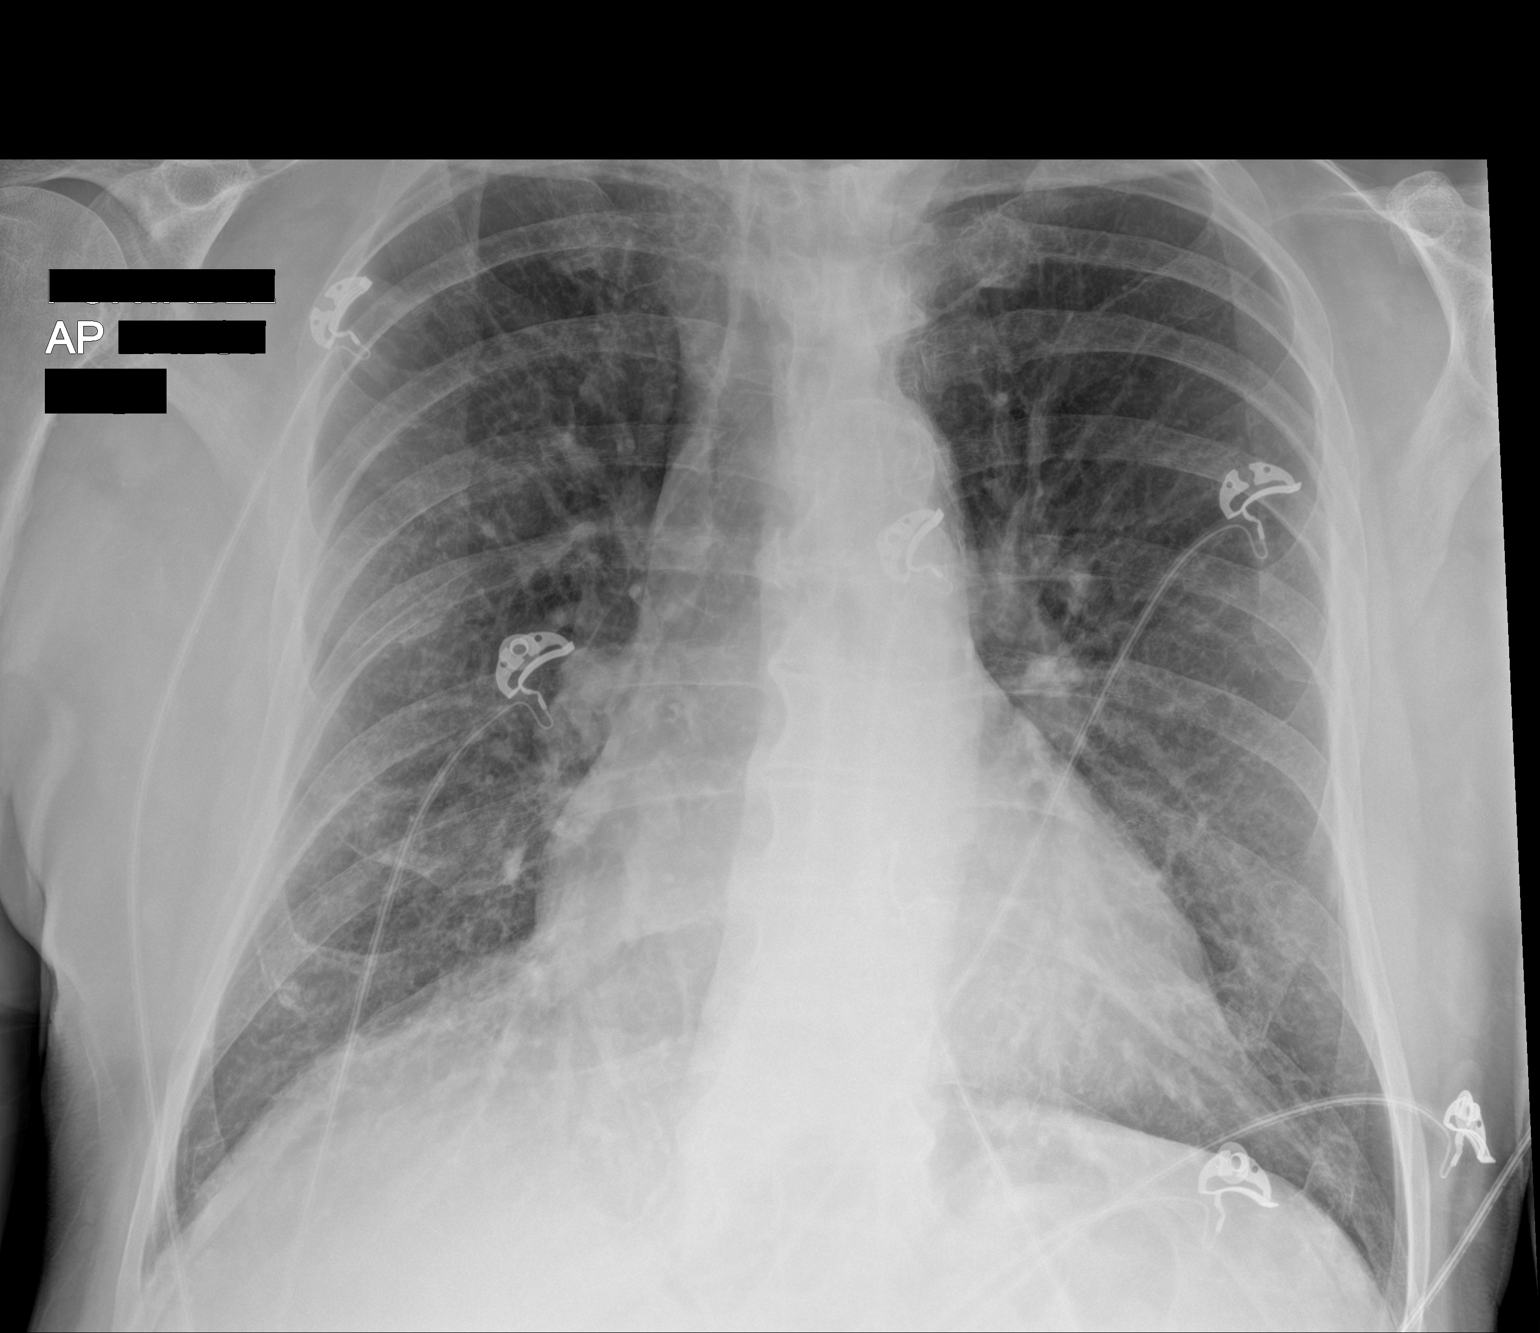

[1 of 1 positions shown; findings below may reference images not displayed]

FINDINGS: Diffuse hazy interstitial opacities with peripheral septal lines,
vascular cephalization and peribronchovascular cuffing. Some
coarsened interstitial changes are similar to comparison studies.
The aorta is calcified. Heart is at the upper limits of normal for
size but may be accentuated by portable technique.
IMPRESSION: Findings suggest developing interstitial edema on a background of
chronic interstitial change.

## 2021-05-24 DIAGNOSIS — I10 Essential (primary) hypertension: Secondary | ICD-10-CM | POA: Diagnosis not present

## 2021-05-24 DIAGNOSIS — I700 Atherosclerosis of aorta: Secondary | ICD-10-CM | POA: Diagnosis not present

## 2021-05-24 DIAGNOSIS — E782 Mixed hyperlipidemia: Secondary | ICD-10-CM | POA: Diagnosis not present

## 2021-05-31 DIAGNOSIS — H532 Diplopia: Secondary | ICD-10-CM | POA: Diagnosis not present

## 2021-06-02 ENCOUNTER — Other Ambulatory Visit: Payer: Self-pay | Admitting: Family Medicine

## 2021-06-02 ENCOUNTER — Telehealth: Payer: Self-pay

## 2021-06-02 DIAGNOSIS — I10 Essential (primary) hypertension: Secondary | ICD-10-CM

## 2021-06-02 NOTE — Telephone Encounter (Signed)
Please review.  See cardiology apt 05/24/2021.  It looks pt Dr. Nehemiah Massed increased telmisartan to 40mg  2 daily.   Thanks,   -Mickel Baas

## 2021-06-02 NOTE — Telephone Encounter (Signed)
Patient needs Telmisartan 40 mg. refilled. He said his dose had increased to 2 tablets daily so he needs new RX sent to [facility].  #180 tablets (for 90 days)

## 2021-06-06 MED ORDER — TELMISARTAN 40 MG PO TABS: 40.0000 mg | ORAL_TABLET | Freq: Two times a day (BID) | ORAL | 1 refills | Status: DC

## 2021-06-06 NOTE — Telephone Encounter (Signed)
Rx was sent to pharmacy. 

## 2021-06-06 NOTE — Addendum Note (Signed)
Addended by: Julieta Bellini on: 06/06/2021 02:26 PM   Modules accepted: Orders

## 2021-07-24 DIAGNOSIS — R310 Gross hematuria: Secondary | ICD-10-CM | POA: Diagnosis not present

## 2021-07-24 DIAGNOSIS — N401 Enlarged prostate with lower urinary tract symptoms: Secondary | ICD-10-CM | POA: Diagnosis not present

## 2021-07-26 DIAGNOSIS — R310 Gross hematuria: Secondary | ICD-10-CM | POA: Diagnosis not present

## 2021-07-26 DIAGNOSIS — N401 Enlarged prostate with lower urinary tract symptoms: Secondary | ICD-10-CM | POA: Diagnosis not present

## 2021-07-28 ENCOUNTER — Other Ambulatory Visit: Payer: Self-pay | Admitting: Urology

## 2021-07-28 DIAGNOSIS — R310 Gross hematuria: Secondary | ICD-10-CM

## 2021-08-03 ENCOUNTER — Ambulatory Visit
Admission: RE | Admit: 2021-08-03 | Discharge: 2021-08-03 | Disposition: A | Discharge status: Home / Self Care | Payer: Medicare Other | Source: Ambulatory Visit | Attending: Urology | Admitting: Urology

## 2021-08-03 ENCOUNTER — Other Ambulatory Visit: Payer: Self-pay

## 2021-08-03 DIAGNOSIS — N202 Calculus of kidney with calculus of ureter: Secondary | ICD-10-CM | POA: Diagnosis not present

## 2021-08-03 DIAGNOSIS — R310 Gross hematuria: Secondary | ICD-10-CM | POA: Insufficient documentation

## 2021-08-03 DIAGNOSIS — I7143 Infrarenal abdominal aortic aneurysm, without rupture: Secondary | ICD-10-CM

## 2021-08-03 HISTORY — DX: Infrarenal abdominal aortic aneurysm, without rupture: I71.43

## 2021-08-03 LAB — POCT I-STAT CREATININE: Creatinine, Ser: 1.6 mg/dL — ABNORMAL HIGH (ref 0.61–1.24)

## 2021-08-03 MED ORDER — IOHEXOL 300 MG/ML  SOLN
100.0000 mL | Freq: Once | INTRAMUSCULAR | Status: AC | PRN
  Administered 2021-08-03: 11:00:00 100 mL via INTRAVENOUS

## 2021-08-09 DIAGNOSIS — N201 Calculus of ureter: Secondary | ICD-10-CM | POA: Diagnosis not present

## 2021-08-09 DIAGNOSIS — R310 Gross hematuria: Secondary | ICD-10-CM | POA: Diagnosis not present

## 2021-08-09 DIAGNOSIS — N210 Calculus in bladder: Secondary | ICD-10-CM | POA: Diagnosis not present

## 2021-08-12 ENCOUNTER — Encounter
Admission: RE | Admit: 2021-08-12 | Discharge: 2021-08-12 | Disposition: A | Discharge status: Home / Self Care | Payer: Medicare Other | Source: Ambulatory Visit | Attending: Urology | Admitting: Urology

## 2021-08-12 ENCOUNTER — Other Ambulatory Visit: Payer: Self-pay

## 2021-08-12 DIAGNOSIS — I10 Essential (primary) hypertension: Secondary | ICD-10-CM

## 2021-08-12 NOTE — Patient Instructions (Signed)
Your procedure is scheduled on:08-18-21 Thursday Report to the Registration Desk on the 1st floor of the [facility].Then proceed to the 2nd floor To find out your arrival time, please call [phone] between 1PM - 3PM on:08-17-21 Wednesday  REMEMBER: Instructions that are not followed completely may result in serious medical risk, up to and including death; or upon the discretion of your surgeon and anesthesiologist your surgery may need to be rescheduled.  Do not eat food after midnight the night before surgery.  No gum chewing, lozengers or hard candies.  You may however, drink CLEAR liquids up to 2 hours before you are scheduled to arrive for your surgery. Do not drink anything within 2 hours of your scheduled arrival time.  Clear liquids include: - water  - apple juice without pulp - gatorade (not RED, PURPLE, OR BLUE) - black coffee or tea (Do NOT add milk or creamers to the coffee or tea) Do NOT drink anything that is not on this list.  TAKE THESE MEDICATIONS THE MORNING OF SURGERY WITH A SIP OF WATER: -isosorbide mononitrate (IMDUR)  -metoprolol succinate (TOPROL-XL) -pantoprazole (PROTONIX)   One week prior to surgery: Stop Anti-inflammatories (NSAIDS) such as Advil, Aleve, Ibuprofen, Motrin, Naproxen, Naprosyn and Aspirin based products such as Excedrin, Goodys Powder, BC Powder.You may however, take Tylenol if needed for pain up until the day of surgery.  Stop ANY OVER THE COUNTER supplements/vitamins NOW (08-12-21) until after surgery.  No Alcohol for 24 hours before or after surgery.  No Smoking including e-cigarettes for 24 hours prior to surgery.  No chewable tobacco products for at least 6 hours prior to surgery.  No nicotine patches on the day of surgery.  Do not use any "recreational" drugs for at least a week prior to your surgery.  Please be advised that the combination of cocaine and anesthesia may have negative outcomes, up to and including death. If  you test positive for cocaine, your surgery will be cancelled.  On the morning of surgery brush your teeth with toothpaste and water, you may rinse your mouth with mouthwash if you wish. Do not swallow any toothpaste or mouthwash.  Do not wear jewelry, make-up, hairpins, clips or nail polish.  Do not wear lotions, powders, or perfumes.   Do not shave body from the neck down 48 hours prior to surgery just in case you cut yourself which could leave a site for infection.  Also, freshly shaved skin may become irritated if using the CHG soap.  Contact lenses, hearing aids and dentures may not be worn into surgery.  Do not bring valuables to the hospital. [facility] is not responsible for any missing/lost belongings or valuables.   Notify your doctor if there is any change in your medical condition (cold, fever, infection).  Wear comfortable clothing (specific to your surgery type) to the hospital.  After surgery, you can help prevent lung complications by doing breathing exercises.  Take deep breaths and cough every 1-2 hours. Your doctor may order a device called an Incentive Spirometer to help you take deep breaths. When coughing or sneezing, hold a pillow firmly against your incision with both hands. This is called splinting. Doing this helps protect your incision. It also decreases belly discomfort.  If you are being admitted to the hospital overnight, leave your suitcase in the car. After surgery it may be brought to your room.  If you are being discharged the day of surgery, you will not be allowed to drive home.  You will need a responsible adult (18 years or older) to drive you home and stay with you that night.   If you are taking public transportation, you will need to have a responsible adult (18 years or older) with you. Please confirm with your physician that it is acceptable to use public transportation.   Please call the [facility] at [phone] if  you have any questions about these instructions.  Surgery Visitation Policy:  Patients undergoing a surgery or procedure may have one family member or support person with them as long as that person is not COVID-19 positive or experiencing its symptoms.  That person may remain in the waiting area during the procedure and may rotate out with other people.  Inpatient Visitation:    Visiting hours are 7 a.m. to 8 p.m. Up to two visitors ages 16+ are allowed at one time in a patient room. The visitors may rotate out with other people during the day. Visitors must check out when they leave, or other visitors will not be allowed. One designated support person may remain overnight. The visitor must pass COVID-19 screenings, use hand sanitizer when entering and exiting the patients room and wear a mask at all times, including in the patients room. Patients must also wear a mask when staff or their visitor are in the room. Masking is required regardless of vaccination status.

## 2021-08-16 ENCOUNTER — Other Ambulatory Visit
Admission: RE | Admit: 2021-08-16 | Discharge: 2021-08-16 | Disposition: A | Discharge status: Home / Self Care | Payer: Medicare Other | Source: Ambulatory Visit | Attending: Urology | Admitting: Urology

## 2021-08-16 ENCOUNTER — Encounter: Payer: Self-pay | Admitting: Urology

## 2021-08-16 ENCOUNTER — Other Ambulatory Visit: Payer: Self-pay

## 2021-08-16 DIAGNOSIS — I10 Essential (primary) hypertension: Secondary | ICD-10-CM | POA: Insufficient documentation

## 2021-08-16 LAB — CBC
HCT: 42.8 % (ref 39.0–52.0)
Hemoglobin: 13.7 g/dL (ref 13.0–17.0)
MCH: 29.5 pg (ref 26.0–34.0)
MCHC: 32 g/dL (ref 30.0–36.0)
MCV: 92.2 fL (ref 80.0–100.0)
Platelets: 229 10*3/uL (ref 150–400)
RBC: 4.64 MIL/uL (ref 4.22–5.81)
RDW: 13.2 % (ref 11.5–15.5)
WBC: 5.2 10*3/uL (ref 4.0–10.5)
nRBC: 0 % (ref 0.0–0.2)

## 2021-08-16 LAB — BASIC METABOLIC PANEL
Anion gap: 5 (ref 5–15)
BUN: 28 mg/dL — ABNORMAL HIGH (ref 8–23)
CO2: 29 mmol/L (ref 22–32)
Calcium: 8.7 mg/dL — ABNORMAL LOW (ref 8.9–10.3)
Chloride: 105 mmol/L (ref 98–111)
Creatinine, Ser: 1.41 mg/dL — ABNORMAL HIGH (ref 0.61–1.24)
GFR, Estimated: 46 mL/min — ABNORMAL LOW (ref >60)
Glucose, Bld: 101 mg/dL — ABNORMAL HIGH (ref 70–99)
Potassium: 4.5 mmol/L (ref 3.5–5.1)
Sodium: 139 mmol/L (ref 135–145)

## 2021-08-16 NOTE — H&P (Signed)
NAMEDIN, BOOKWALTER MEDICAL RECORD NO: [id number] ACCOUNT NO: [id number] DATE OF BIRTH: October 31, 1925 PHYSICIAN: Otelia Limes. Yves Dill, MD  History and Physical   DATE OF ADMISSION: 08/18/2021  Same day surgery 08/18/2021.  CHIEF COMPLAINT:  Hematuria.  HISTORY OF PRESENT ILLNESS:  The patient is a 87 year old white male who developed gross hematuria in early December.  He was evaluated with cystoscopy, which revealed multiple less than 1 mm bladder stones and a bladder diverticulum.  He had a CT scan  which revealed four distal left ureteral stones measuring 5 mm each without hydronephrosis.  He comes in now for left ureteroscopic ureterolithotomy with holmium laser lithotripsy.  PAST MEDICAL HISTORY: ALLERGIES:  No drug allergies.  CURRENT MEDICATIONS:  Imdur, Mevacor, Toprol-XL, Aleve, Nitrostat p.r.n., Protonix, Micardis and aspirin.  PAST SURGICAL HISTORY: 1.  Tonsillectomy in 1935. 2.  Bilateral cataract surgery, 2012. 3.  Removal of superficial skin cancer 2021.  PAST AND CURRENT MEDICAL CONDITIONS: 1.  Hypertension. 2.  Hypercholesterolemia. 3.  GERD.  REVIEW OF SYSTEMS:  The patient denies chest pain, shortness of breath, diabetes or stroke.  SOCIAL HISTORY:  The patient quit smoking in 1924, with a 10-pack-year history.  He consumes three alcoholic beverages per week.  FAMILY HISTORY:  Negative for urologic disease or urological malignancy.  PHYSICAL EXAMINATION:   VITAL SIGNS:  Height was 6 feet 1 inch, weight 208 pounds, BMI 27. GENERAL:  Elderly white male in no acute distress. HEENT:  Sclerae were clear.  Pupils are equally round, reactive to light and accommodation.  Extraocular movements are intact. NECK:  No palpable masses or tenderness. LYMPHATIC:  No palpable cervical or inguinal adenopathy. LUNGS:  Clear to auscultation. CARDIAC:  Regular rhythm and rate. ABDOMEN:  Soft, nontender.  No CVA tenderness. GENITOURINARY: Uncircumcised.  Testes were both smooth,  nontender, approximately 18 mL in size each.  Rectal exam 30 gram smooth.  Nontender prostate. NEUROMUSCULAR:  Alert and oriented x3.  IMPRESSION: 1.  Distal left ureterolithiasis. 2.  Hematuria.  PLAN:  Left ureteroscopic ureterolithotomy with holmium laser lithotripsy.   PUS D: 08/11/2021 3:43:20 pm T: 08/11/2021 4:32:00 pm  JOB: [id number]/ [id number]

## 2021-08-16 NOTE — Progress Notes (Signed)
Perioperative Services  Pre-Admission/Anesthesia Testing Clinical Review  Date: 08/17/21  Patient Demographics:  Name: Adam Hanna. DOB:   Sep 01, 1925 MRN:   [id number]  Planned Surgical Procedure(s):    Case: [id number] Date/Time: 08/18/21 0825   Procedure: CYSTOSCOPY/URETEROSCOPY/HOLMIUM LASER/STENT PLACEMENT (Left)   Anesthesia type: Choice   Pre-op diagnosis: CALCULUS   Location: [facility] OR ROOM 10 / [facility] FOR ANESTHESIA GROUP   Surgeons: Royston Cowper, MD   NOTE: Available PAT nursing documentation and vital signs have been reviewed. Clinical nursing staff has updated patient's PMH/PSHx, current medication list, and drug allergies/intolerances to ensure comprehensive history available to assist in medical decision making as it pertains to the aforementioned surgical procedure and anticipated anesthetic course. Extensive review of available clinical information performed. [place] PMH and PSHx updated with any diagnoses/procedures that  may have been inadvertently omitted during his intake with the pre-admission testing department's nursing staff.  Clinical Discussion:  Adam Benak. is a 87 y.o. male who is submitted for pre-surgical anesthesia review and clearance prior to him undergoing the above procedure. Patient is a Former Smoker (20 pack years; quit 07/1963). Pertinent PMH includes: AAA, valvular regurgitation, first-degree heart block, PVD, aortic atherosclerosis, HLD, GERD (on daily PPI), multiple recurrent skin cancers.  Patient is followed by cardiology Nehemiah Massed, MD). He was last seen in the cardiology clinic on 05/24/2021; notes reviewed.  At the time of his clinic visit, patient doing well overall from a cardiovascular perspective.  He denied any episodes of chest pain, however complained of shortness of breath.  He denied any PND, orthopnea, palpitations, significant peripheral edema, vertiginous symptoms, or presyncope/syncope.  PMH significant for  cardiovascular diagnoses.  Myocardial perfusion imaging study performed on 05/01/2019 revealed no evidence of stress-induced myocardial ischemia or arrhythmia.  EF reduced at 44%.  TTE performed on 05/13/2019 demonstrated mild left ventricular systolic dysfunction with EF 45%.  There was mild pan valvular regurgitation.  There was no evidence of a transvalvular gradient to suggest stenosis.  CT imaging of the abdomen and pelvis performed on 08/03/2021 revealed a presumably new 4.7 x 4.6 cm infrarenal abdominal aortic aneurysm.  Evaded at 168/88 on currently prescribed blood pressure nitrate, beta-blocker, and ARB therapies.  Patient is on a statin for his HLD.  He is not diabetic.  Patient has as needed SL NTG to use for episodes of angina; no recent use.  Functional capacity limited by age-related debility, however patient still felt to be able to achieve at least 4 METS of activity without angina/anginal equivalent symptoms.  No changes were made to his medication regimen.  Patient follow-up with outpatient cardiology in 1 year or sooner if needed.  Adam China. is scheduled for an CYSTOSCOPY/URETEROSCOPY/HOLMIUM LASER/STENT PLACEMENT (Left) on 08/18/2021 with Dr. Maryan Puls, MD.  Given patient's past medical history significant for cardiovascular diagnoses, presurgical cardiac clearance was sought by the PAT team. Per cardiology, "this patient is optimized for surgery and may proceed with the planned procedural course with a LOW risk of significant perioperative cardiovascular complications".  In review of his medication reconciliation, it is noted the patient is not currently taking any type of prescribed anticoagulation/antiplatelet therapies that will need to be held during the perioperative period.  Patient denies previous perioperative complications with anesthesia in the past.  In review his EMR, there are no records available for review pertaining to past procedural/anesthetic courses  within the [facility] system.  Vitals with BMI 04/05/2021 10/06/2020 12/08/2019  Height 6' 1.5" - -  Weight 208 lbs 213 lbs -  BMI 97.35 - -  Systolic 329 924 268  Diastolic 75 63 74  Pulse 66 60 60    Providers/Specialists:   NOTE: Primary physician provider listed below. Patient may have been seen by APP or partner within same practice.   PROVIDER ROLE / SPECIALTY LAST Towana Badger, MD Urology ???  Jerrol Banana., MD Primary Care Provider 04/05/2021  Serafina Royals, MD Cardiology 05/24/2021   Allergies:  Patient has no known allergies.  Current Home Medications:   No current facility-administered medications for this encounter.    isosorbide mononitrate (IMDUR) 30 MG 24 hr tablet   lovastatin (MEVACOR) 40 MG tablet   metoprolol succinate (TOPROL-XL) 25 MG 24 hr tablet   nitroGLYCERIN (NITROSTAT) 0.4 MG SL tablet   pantoprazole (PROTONIX) 20 MG tablet   telmisartan (MICARDIS) 40 MG tablet   History:   Past Medical History:  Diagnosis Date   Aneurysm of infrarenal abdominal aorta 08/03/2021   a.) CT 08/03/2021: measured 4.7 x 4.6 cm.   Aortic atherosclerosis ([place])    Basal cell carcinoma 02/11/2009   Right medial brow. Excised: 02/23/2010 (recurrent)   Basal cell carcinoma 05/01/2016   Left nasal tip. Nodular pattern.   Basal cell carcinoma 08/25/2020   R prox forearm near the elbow - ED&C    Bone spur    First degree heart block    GERD (gastroesophageal reflux disease)    H/O echocardiogram 05/13/2019   a.) TTE 05/13/2019: mild LV dysfunction with EF 45%; mild panvalvular regurgitation   Hx of basal cell carcinoma 2007   multiple sites   Hx of dysplastic nevus 05/28/2002   R-4 medial toe   Hx of malignant melanoma 1991   L post. auricular. WLE performed in [place]   Hx of squamous cell carcinoma of skin 2012   multiple sites   Hyperlipidemia    Hypertension    Nephrolithiasis    PVD (peripheral vascular disease) ([place])    Squamous cell  carcinoma of skin 01/06/2019   Left sideburn, preauricular area. [facility]   Valvular regurgitation 05/13/2019   a.) TTE 09/22/202020: EF 45%; mild panvalvular regurgitation   Past Surgical History:  Procedure Laterality Date   BACK SURGERY     bone spur between L4-5   CATARACT EXTRACTION Bilateral    COLONOSCOPY     CYST REMOVAL NECK     MELANOMA EXCISION Left 1991   post. auricular   Family History  Problem Relation Age of Onset   Congestive Heart Failure Mother    Dementia Father    Other Sister        Suicide by gunshot wound   Social History   Tobacco Use   Smoking status: Former    Packs/day: 1.00    Years: 20.00    Pack years: 20.00    Types: Cigarettes    Quit date: 08/21/1963    Years since quitting: 58.0   Smokeless tobacco: Never  Vaping Use   Vaping Use: Never used  Substance Use Topics   Alcohol use: Yes    Alcohol/week: 0.0 - 12.0 standard drinks    Comment: 1 beer , wine or scotch before dinner (3-4 nights a week)   Drug use: No    Pertinent Clinical Results:  LABS: Labs reviewed: Acceptable for surgery.  Hospital Outpatient Visit on 08/16/2021  Component Date Value Ref Range Status   WBC 08/16/2021 5.2  4.0 - 10.5 K/uL Final  RBC 08/16/2021 4.64  4.22 - 5.81 MIL/uL Final   Hemoglobin 08/16/2021 13.7  13.0 - 17.0 g/dL Final   HCT 08/16/2021 42.8  39.0 - 52.0 % Final   MCV 08/16/2021 92.2  80.0 - 100.0 fL Final   MCH 08/16/2021 29.5  26.0 - 34.0 pg Final   MCHC 08/16/2021 32.0  30.0 - 36.0 g/dL Final   RDW 08/16/2021 13.2  11.5 - 15.5 % Final   Platelets 08/16/2021 229  150 - 400 K/uL Final   nRBC 08/16/2021 0.0  0.0 - 0.2 % Final   Performed at [facility], [place], [place] [address]   Sodium 08/16/2021 139  135 - 145 mmol/L Final   Potassium 08/16/2021 4.5  3.5 - 5.1 mmol/L Final   Chloride 08/16/2021 105  98 - 111 mmol/L Final   CO2 08/16/2021 29  22 - 32 mmol/L Final   Glucose, Bld 08/16/2021 101 (H)  70 - 99  mg/dL Final   Glucose reference range applies only to samples taken after fasting for at least 8 hours.   BUN 08/16/2021 28 (H)  8 - 23 mg/dL Final   Creatinine, Ser 08/16/2021 1.41 (H)  0.61 - 1.24 mg/dL Final   Calcium 08/16/2021 8.7 (L)  8.9 - 10.3 mg/dL Final   GFR, Estimated 08/16/2021 46 (L)  >60 mL/min Final   Comment: (NOTE) Calculated using the CKD-EPI Creatinine Equation (2021)    Anion gap 08/16/2021 5  5 - 15 Final   Performed at [facility], [address] [place], [place] [address]    ECG: Date: 05/24/2021 Rate: 64 bpm Rhythm:  Sinus rhythm with sinus arrhythmia and first-degree AV block; minimal voltage criteria for LVH Axis (leads I and aVF): Normal Intervals: PR 214 ms. QRS 80 ms. QTc 439 ms. ST segment and T wave changes: No evidence of acute ST segment elevation or depression Comparison: Similar to previous tracing obtained on 08/21/2019 NOTE: Tracing obtained at [facility]; unable for review. Above based on cardiologist's interpretation.    IMAGING / PROCEDURES: CT CHEST, ABDOMEN, PELVIS WITH/WITHOUT CONTRAST performed on 08/03/2021 There are four stacked calculi in the most distal left ureter near the ureterovesicular junction measuring up to 0.5 cm. Tiny calculi at the right ureterovesicular junction or within the bladder lumen measuring up to 0.3 cm. No hydronephrosis or hydroureter. Additional small nonobstructive calculus of the left kidney. Mild, asymmetric atrophy of the left kidney, likely due to prior obstructive insult. No suspicious renal mass or contrast enhancement. No urinary tract filling defect on delayed phase imaging. Prostatomegaly with mild thickening of the urinary bladder, likely secondary to chronic outlet obstruction. Aneurysm of the infrarenal abdominal aorta measuring up to 4.7 x 4.6 cm.   Aortic atherosclerosis   TRANSTHORACIC ECHOCARDIOGRAM performed on 05/13/2019 LVEF 45% Mild left ventricular systolic  dysfunction Normal right ventricular systolic dysfunction Mild pan valvular regurgitation No valvular stenosis No pericardial effusion  MYOCARDIAL PERFUSION IMAGING STUDY (LEXISCAN) performed on 05/01/2019 LVEF 44% Normal myocardial thickening and wall motion Artifacts were noted Left ventricular cavity size normal No evidence of stress-induced myocardial ischemia or arrhythmia The overall quality of the study was fair  Impression and Plan:  Adam China. has been referred for pre-anesthesia review and clearance prior to him undergoing the planned anesthetic and procedural courses. Available labs, pertinent testing, and imaging results were personally reviewed by me. This patient has been appropriately cleared by cardiology with an overall LOW risk of significant perioperative cardiovascular complications.  Based on clinical review performed today (08/17/21), barring any significant acute changes in the patient's overall condition, it is anticipated that he will be able to proceed with the planned surgical intervention. Any acute changes in clinical condition may necessitate his procedure being postponed and/or cancelled. Patient will meet with anesthesia team (MD and/or CRNA) on the day of his procedure for preoperative evaluation/assessment. Questions regarding anesthetic course will be fielded at that time.   Pre-surgical instructions were reviewed with the patient during his PAT appointment and questions were fielded by PAT clinical staff. Patient was advised that if any questions or concerns arise prior to his procedure then he should return a call to PAT and/or his surgeon's office to discuss.  Honor Loh, MSN, APRN, FNP-C, CEN [facility]  Peri-operative Services Nurse Practitioner Phone: [phone] Fax: [phone] 08/17/21 3:40 PM  NOTE: This note has been prepared using Dragon dictation software. Despite my best ability to proofread, there is  always the potential that unintentional transcriptional errors may still occur from this process.

## 2021-08-17 NOTE — Progress Notes (Signed)
Cardiac clearance form received from Dr. Alveria Apley office. Patient risk assessment is "low" and patient is optimized for surgery.

## 2021-08-18 ENCOUNTER — Encounter: Payer: Self-pay | Admitting: Urology

## 2021-08-18 ENCOUNTER — Ambulatory Visit: Payer: Medicare Other

## 2021-08-18 ENCOUNTER — Ambulatory Visit: Payer: Medicare Other | Admitting: Urgent Care

## 2021-08-18 ENCOUNTER — Other Ambulatory Visit: Payer: Self-pay

## 2021-08-18 ENCOUNTER — Encounter
Admission: RE | Disposition: A | Discharge status: Home / Self Care | Payer: Self-pay | Source: Home / Self Care | Attending: Urology

## 2021-08-18 ENCOUNTER — Ambulatory Visit
Admission: RE | Admit: 2021-08-18 | Discharge: 2021-08-18 | Disposition: A | Discharge status: Home / Self Care | Payer: Medicare Other | Attending: Urology | Admitting: Urology

## 2021-08-18 DIAGNOSIS — N200 Calculus of kidney: Secondary | ICD-10-CM | POA: Diagnosis not present

## 2021-08-18 DIAGNOSIS — E785 Hyperlipidemia, unspecified: Secondary | ICD-10-CM | POA: Diagnosis not present

## 2021-08-18 DIAGNOSIS — Z85828 Personal history of other malignant neoplasm of skin: Secondary | ICD-10-CM | POA: Insufficient documentation

## 2021-08-18 DIAGNOSIS — N201 Calculus of ureter: Secondary | ICD-10-CM | POA: Diagnosis not present

## 2021-08-18 DIAGNOSIS — N323 Diverticulum of bladder: Secondary | ICD-10-CM | POA: Insufficient documentation

## 2021-08-18 DIAGNOSIS — R310 Gross hematuria: Secondary | ICD-10-CM | POA: Diagnosis not present

## 2021-08-18 DIAGNOSIS — I440 Atrioventricular block, first degree: Secondary | ICD-10-CM | POA: Diagnosis not present

## 2021-08-18 DIAGNOSIS — I700 Atherosclerosis of aorta: Secondary | ICD-10-CM | POA: Diagnosis not present

## 2021-08-18 DIAGNOSIS — N210 Calculus in bladder: Secondary | ICD-10-CM | POA: Insufficient documentation

## 2021-08-18 DIAGNOSIS — Z87891 Personal history of nicotine dependence: Secondary | ICD-10-CM | POA: Diagnosis not present

## 2021-08-18 DIAGNOSIS — K219 Gastro-esophageal reflux disease without esophagitis: Secondary | ICD-10-CM | POA: Insufficient documentation

## 2021-08-18 DIAGNOSIS — I714 Abdominal aortic aneurysm, without rupture, unspecified: Secondary | ICD-10-CM | POA: Diagnosis not present

## 2021-08-18 DIAGNOSIS — I739 Peripheral vascular disease, unspecified: Secondary | ICD-10-CM | POA: Insufficient documentation

## 2021-08-18 DIAGNOSIS — N189 Chronic kidney disease, unspecified: Secondary | ICD-10-CM | POA: Diagnosis not present

## 2021-08-18 DIAGNOSIS — I129 Hypertensive chronic kidney disease with stage 1 through stage 4 chronic kidney disease, or unspecified chronic kidney disease: Secondary | ICD-10-CM | POA: Diagnosis not present

## 2021-08-18 HISTORY — PX: CYSTOSCOPY W/ RETROGRADES: SHX1426

## 2021-08-18 HISTORY — DX: Atherosclerosis of aorta: I70.0

## 2021-08-18 HISTORY — DX: Atrioventricular block, first degree: I44.0

## 2021-08-18 HISTORY — DX: Peripheral vascular disease, unspecified: I73.9

## 2021-08-18 HISTORY — PX: CYSTOSCOPY/URETEROSCOPY/HOLMIUM LASER/STENT PLACEMENT: SHX6546

## 2021-08-18 HISTORY — DX: Calculus of kidney: N20.0

## 2021-08-18 SURGERY — CYSTOSCOPY/URETEROSCOPY/HOLMIUM LASER/STENT PLACEMENT
Anesthesia: General | Laterality: Left

## 2021-08-18 MED ORDER — LACTATED RINGERS IV SOLN: INTRAVENOUS | Status: DC

## 2021-08-18 MED ORDER — ONDANSETRON 8 MG PO TBDP: 4.0000 mg | ORAL_TABLET | Freq: Four times a day (QID) | ORAL | 3 refills | Status: AC | PRN

## 2021-08-18 MED ORDER — CEFAZOLIN SODIUM-DEXTROSE 1-4 GM/50ML-% IV SOLN
1.0000 g | Freq: Once | INTRAVENOUS | Status: AC
  Administered 2021-08-18: 10:00:00 1 g via INTRAVENOUS

## 2021-08-18 MED ORDER — DOCUSATE SODIUM 100 MG PO CAPS: 200.0000 mg | ORAL_CAPSULE | Freq: Two times a day (BID) | ORAL | 3 refills | Status: DC

## 2021-08-18 MED ORDER — MEPERIDINE HCL 25 MG/ML IJ SOLN: 6.2500 mg | INTRAMUSCULAR | Status: DC | PRN

## 2021-08-18 MED ORDER — ONDANSETRON HCL 4 MG/2ML IJ SOLN: 4.0000 mg | Freq: Once | INTRAMUSCULAR | Status: DC | PRN

## 2021-08-18 MED ORDER — ONDANSETRON HCL 4 MG/2ML IJ SOLN
INTRAMUSCULAR | Status: AC
  Filled 2021-08-18: qty 2

## 2021-08-18 MED ORDER — PROPOFOL 10 MG/ML IV BOLUS
INTRAVENOUS | Status: DC | PRN
  Administered 2021-08-18: 150 mg via INTRAVENOUS

## 2021-08-18 MED ORDER — LIDOCAINE HCL (PF) 2 % IJ SOLN
INTRAMUSCULAR | Status: AC
  Filled 2021-08-18: qty 5

## 2021-08-18 MED ORDER — PROPOFOL 10 MG/ML IV BOLUS
INTRAVENOUS | Status: AC
  Filled 2021-08-18: qty 20

## 2021-08-18 MED ORDER — LIDOCAINE HCL URETHRAL/MUCOSAL 2 % EX GEL
CUTANEOUS | Status: AC
  Filled 2021-08-18: qty 10

## 2021-08-18 MED ORDER — FENTANYL CITRATE (PF) 100 MCG/2ML IJ SOLN
INTRAMUSCULAR | Status: AC
  Filled 2021-08-18: qty 2

## 2021-08-18 MED ORDER — CHLORHEXIDINE GLUCONATE 0.12 % MT SOLN
OROMUCOSAL | Status: AC
  Administered 2021-08-18: 08:00:00 15 mL via OROMUCOSAL
  Filled 2021-08-18: qty 15

## 2021-08-18 MED ORDER — CHLORHEXIDINE GLUCONATE 0.12 % MT SOLN: 15.0000 mL | Freq: Once | OROMUCOSAL | Status: AC

## 2021-08-18 MED ORDER — HYOSCYAMINE SULFATE SL 0.125 MG SL SUBL: 0.1250 mg | SUBLINGUAL_TABLET | SUBLINGUAL | 3 refills | Status: AC | PRN

## 2021-08-18 MED ORDER — ONDANSETRON HCL 4 MG/2ML IJ SOLN
INTRAMUSCULAR | Status: DC | PRN
  Administered 2021-08-18: 4 mg via INTRAVENOUS

## 2021-08-18 MED ORDER — SULFAMETHOXAZOLE-TRIMETHOPRIM 800-160 MG PO TABS: 1.0000 | ORAL_TABLET | Freq: Two times a day (BID) | ORAL | 0 refills | Status: DC

## 2021-08-18 MED ORDER — ORAL CARE MOUTH RINSE: 15.0000 mL | Freq: Once | OROMUCOSAL | Status: AC

## 2021-08-18 MED ORDER — ACETAMINOPHEN-CODEINE #3 300-30 MG PO TABS: 1.0000 | ORAL_TABLET | ORAL | 1 refills | Status: DC | PRN

## 2021-08-18 MED ORDER — EPHEDRINE SULFATE 50 MG/ML IJ SOLN
INTRAMUSCULAR | Status: DC | PRN
  Administered 2021-08-18 (×2): 5 mg via INTRAVENOUS

## 2021-08-18 MED ORDER — FENTANYL CITRATE (PF) 100 MCG/2ML IJ SOLN: 25.0000 ug | INTRAMUSCULAR | Status: DC | PRN

## 2021-08-18 MED ORDER — FENTANYL CITRATE (PF) 100 MCG/2ML IJ SOLN
INTRAMUSCULAR | Status: DC | PRN
  Administered 2021-08-18 (×2): 25 ug via INTRAVENOUS

## 2021-08-18 MED ORDER — LIDOCAINE HCL (CARDIAC) PF 100 MG/5ML IV SOSY
PREFILLED_SYRINGE | INTRAVENOUS | Status: DC | PRN
  Administered 2021-08-18: 80 mg via INTRAVENOUS

## 2021-08-18 MED ORDER — CEFAZOLIN SODIUM-DEXTROSE 1-4 GM/50ML-% IV SOLN
INTRAVENOUS | Status: AC
  Filled 2021-08-18: qty 50

## 2021-08-18 SURGICAL SUPPLY — 25 items
BAG DRAIN CYSTO-URO LG1000N (MISCELLANEOUS) ×4 IMPLANT
BRUSH SCRUB EZ  4% CHG (MISCELLANEOUS) ×2
BRUSH SCRUB EZ 4% CHG (MISCELLANEOUS) IMPLANT
CONRAY 43 FOR UROLOGY 50M (MISCELLANEOUS) ×2 IMPLANT
DRSG TEGADERM 2X2.25 PEDS (GAUZE/BANDAGES/DRESSINGS) ×2 IMPLANT
FIBER LASER MOSES 365 DFL (Laser) ×2 IMPLANT
GAUZE 4X4 16PLY ~~LOC~~+RFID DBL (SPONGE) ×8 IMPLANT
GLOVE SURG ENC MOIS LTX SZ7 (GLOVE) ×8 IMPLANT
GLOVE SURG ENC MOIS LTX SZ7.5 (GLOVE) ×4 IMPLANT
GOWN STRL REUS W/ TWL LRG LVL4 (GOWN DISPOSABLE) ×2 IMPLANT
GOWN STRL REUS W/ TWL XL LVL3 (GOWN DISPOSABLE) ×2 IMPLANT
GOWN STRL REUS W/TWL LRG LVL4 (GOWN DISPOSABLE) ×4
GOWN STRL REUS W/TWL XL LVL3 (GOWN DISPOSABLE) ×4
GUIDEWIRE STR ZIPWIRE 035X150 (MISCELLANEOUS) ×2 IMPLANT
IV NS IRRIG 3000ML ARTHROMATIC (IV SOLUTION) ×4 IMPLANT
KIT TURNOVER CYSTO (KITS) ×4 IMPLANT
MANIFOLD NEPTUNE II (INSTRUMENTS) ×4 IMPLANT
PACK CYSTO AR (MISCELLANEOUS) ×4 IMPLANT
SET CYSTO W/LG BORE CLAMP LF (SET/KITS/TRAYS/PACK) ×4 IMPLANT
SOL PREP PVP 2OZ (MISCELLANEOUS)
SOLUTION PREP PVP 2OZ (MISCELLANEOUS) ×2 IMPLANT
STENT URET 6FRX24 CONTOUR (STENTS) ×2 IMPLANT
STENT URET 6FRX26 CONTOUR (STENTS) ×4 IMPLANT
WATER STERILE IRR 1000ML POUR (IV SOLUTION) ×2 IMPLANT
WATER STERILE IRR 500ML POUR (IV SOLUTION) ×4 IMPLANT

## 2021-08-18 NOTE — Anesthesia Preprocedure Evaluation (Addendum)
Anesthesia Evaluation  Patient identified by MRN, date of birth, ID band Patient awake    Reviewed: Allergy & Precautions, NPO status , Patient's Chart, lab work & pertinent test results, reviewed documented beta blocker date and time   Airway Mallampati: III  TM Distance: >3 FB Neck ROM: Full    Dental  (+) Poor Dentition   Pulmonary neg pulmonary ROS, former smoker,    Pulmonary exam normal        Cardiovascular hypertension, Pt. on home beta blockers and Pt. on medications + Peripheral Vascular Disease  Normal cardiovascular exam+ dysrhythmias + Valvular Problems/Murmurs      Neuro/Psych  Neuromuscular disease negative psych ROS   GI/Hepatic Neg liver ROS, GERD  Medicated,  Endo/Other  negative endocrine ROS  Renal/GU Renal disease  negative genitourinary   Musculoskeletal negative musculoskeletal ROS (+)   Abdominal   Peds negative pediatric ROS (+)  Hematology negative hematology ROS (+)   Anesthesia Other Findings Aneurysm of infrarenal abdominal aorta 08/03/2021 a.) CT 08/03/2021: measured 4.7 x 4.6 cm.  Aortic atherosclerosis ([place])   Basal cell carcinoma 02/11/2009 Right medial brow. Excised: 02/23/2010 (recurrent) Basal cell carcinoma 05/01/2016 Left nasal tip. Nodular pattern.  Basal cell carcinoma 08/25/2020 R prox forearm near the elbow - ED&C  Bone spur    GERD (gastroesophageal reflux disease) H/O echocardiogram 05/13/2019 a.) TTE 05/13/2019: mild LV dysfunction with EF 45%; mild panvalvular regurgitation Hx of basal cell carcinoma 2007 multiple sites  Hx of dysplastic nevus 05/28/2002 R-4 medial toe  Hx of malignant melanoma 1991 L post. auricular. WLE performed in [place] Hx of squamous cell carcinoma of skin 2012 multiple sites  Hyperlipidemia    Hypertension    Nephrolithiasis    PVD (peripheral vascular disease) ([place]) Squamous cell carcinoma of skin 01/06/2019 Left sideburn, preauricular area.  [facility]  Valvular regurgitation 05/13/2019 a.) TTE 09/22/202020: EF 45%; mild panvalvular regurgitation     Reproductive/Obstetrics negative OB ROS                            Anesthesia Physical Anesthesia Plan  ASA: 3  Anesthesia Plan: General   Post-op Pain Management:    Induction: Intravenous  PONV Risk Score and Plan: 3 and Propofol infusion, Ondansetron and Midazolam  Airway Management Planned: LMA  Additional Equipment:   Intra-op Plan:   Post-operative Plan: Extubation in OR  Informed Consent: I have reviewed the patients History and Physical, chart, labs and discussed the procedure including the risks, benefits and alternatives for the proposed anesthesia with the patient or authorized representative who has indicated his/her understanding and acceptance.       Plan Discussed with: CRNA, Anesthesiologist and Surgeon  Anesthesia Plan Comments:        Anesthesia Quick Evaluation

## 2021-08-18 NOTE — Anesthesia Procedure Notes (Signed)
Procedure Name: LMA Insertion Date/Time: 08/18/2021 9:54 AM Performed by: Hedda Slade, CRNA Pre-anesthesia Checklist: Patient identified, Patient being monitored, Timeout performed, Emergency Drugs available and Suction available Patient Re-evaluated:Patient Re-evaluated prior to induction Oxygen Delivery Method: Circle system utilized Preoxygenation: Pre-oxygenation with 100% oxygen Induction Type: IV induction Ventilation: Mask ventilation without difficulty LMA: LMA inserted LMA Size: 4.5 Tube type: Oral Number of attempts: 1 Placement Confirmation: positive ETCO2 and breath sounds checked- equal and bilateral Tube secured with: Tape Dental Injury: Teeth and Oropharynx as per pre-operative assessment

## 2021-08-18 NOTE — Transfer of Care (Signed)
Immediate Anesthesia Transfer of Care Note  Patient: Adam Hanna.  Procedure(s) Performed: CYSTOSCOPY/URETEROSCOPY/HOLMIUM LASER/STENT PLACEMENT (Left) CYSTOSCOPY WITH RETROGRADE PYELOGRAM  Patient Location: PACU  Anesthesia Type:General  Level of Consciousness: sedated  Airway & Oxygen Therapy: Patient Spontanous Breathing and Patient connected to nasal cannula oxygen  Post-op Assessment: Report given to RN and Post -op Vital signs reviewed and stable  Post vital signs: Reviewed and stable  Last Vitals:  Vitals Value Taken Time  BP 136/71 08/18/21 1100  Temp    Pulse 70 08/18/21 1101  Resp 9 08/18/21 1101  SpO2 99 % 08/18/21 1101  Vitals shown include unvalidated device data.  Last Pain:  Vitals:   08/18/21 0749  TempSrc: Temporal  PainSc: 0-No pain         Complications: No notable events documented.

## 2021-08-18 NOTE — Discharge Instructions (Addendum)
Laser Therapy for Kidney Stones, Care After This sheet gives you information about how to care for yourself after your procedure. Your health care provider may also give you more specific instructions. If you have problems or questions, contact your health care provider. What can I expect after the procedure? After the procedure, it is common to have: Pain. A burning sensation while urinating. Small amounts of blood in your urine. A need to urinate frequently. Pieces of kidney stone in your urine. Mild discomfort when urinating that may be felt in the back. You may experience this if you have a flexible tube (stent) in your ureter. Follow these instructions at home: A comparison of three sample cups showing dark yellow, yellow, and pale yellow urine.  Medicines Take over-the-counter and prescription medicines only as told by your health care provider. If you were prescribed an antibiotic medicine, take it as told by your health care provider. Do not stop taking the antibiotic even if you start to feel better. Ask your health care provider if the medicine prescribed to you: Requires you to avoid driving or using heavy machinery. Can cause constipation. You may need to take actions to prevent or treat constipation, such as: Take over-the-counter or prescription medicines. Eat foods that are high in fiber, such as beans, whole grains, and fresh fruits and vegetables. Limit foods that are high in fat and processed sugars, such as fried or sweet foods. Activity Return to your normal activities as told by your health care provider. Ask your health care provider what activities are safe for you. Do not drive for 24 hours if you were given a sedative during your procedure. General instructions If your health care provider approves, you may take a warm bath to ease discomfort and burning. Drink enough fluid to keep your urine pale yellow. Your health care provider may recommend drinking two 8 oz  (237 mL) glasses of water per hour for a few hours after your procedure. You may be asked to strain your urine to collect any stone fragments that you pass. These fragments may be tested. Keep all follow-up visits as told by your health care provider. This is important. If you have a stent, you will need to return to your health care provider to have the stent removed. Contact a health care provider if you: Have pain or a burning feeling that lasts more than 2 days. Feel nauseous. Vomit more and more often. Have difficulty urinating. Have pain that gets worse or does not get better with medicine. Get help right away if: You are unable to urinate, even if your bladder feels full. You have: Bright red blood or blood clots in your urine. More blood in your urine. Severe pain or discomfort. A fever or shaking chills. Abdominal pain. Difficulty breathing. Swelling in your legs. Summary After the procedure, it is common to have a burning sensation while urinating and small amounts of blood in your urine. Take over-the-counter and prescription medicines only as told by your health care provider. Drink enough fluid to keep your urine pale yellow. Keep all follow-up visits as told by your health care provider. This is important. This information is not intended to replace advice given to you by your health care provider. Make sure you discuss any questions you have with your health care provider. Document Revised: 04/11/2021 Document Reviewed: 04/11/2021 Elsevier Patient Education  2022 [place].     Ureteral Stent Implantation, Care After This sheet gives you information about how to care  for yourself after your procedure. Your health care provider may also give you more specific instructions. If you have problems or questions, contact your health care provider. What can I expect after the procedure? After the procedure, it is common to have: Nausea. Mild pain when you urinate. You may  feel this pain in your lower back or lower abdomen. The pain should stop within a few minutes after you urinate. This may last for up to 1 week. A small amount of blood in your urine for several days. Follow these instructions at home: Medicines Take over-the-counter and prescription medicines only as told by your health care provider. If you were prescribed an antibiotic medicine, take it as told by your health care provider. Do not stop taking the antibiotic even if you start to feel better. Do not drive for 24 hours if you were given a sedative during your procedure. Ask your health care provider if the medicine prescribed to you requires you to avoid driving or using heavy machinery. Activity Rest as told by your health care provider. Avoid sitting for a long time without moving. Get up to take short walks every 1-2 hours. This is important to improve blood flow and breathing. Ask for help if you feel weak or unsteady. Return to your normal activities as told by your health care provider. Ask your health care provider what activities are safe for you. General instructions Watch for any blood in your urine. Call your health care provider if the amount of blood in your urine increases. If you have a catheter: Follow instructions from your health care provider about taking care of your catheter and collection bag. Do not take baths, swim, or use a hot tub until your health care provider approves. Ask your health care provider if you may take showers. You may only be allowed to take sponge baths. Drink enough fluid to keep your urine pale yellow. Do not use any products that contain nicotine or tobacco, such as cigarettes, e-cigarettes, and chewing tobacco. These can delay healing after surgery. If you need help quitting, ask your health care provider. Keep all follow-up visits as told by your health care provider. This is important. Contact a health care provider if: You have pain that gets  worse or does not get better with medicine, especially pain when you urinate. You have difficulty urinating. You feel nauseous or you vomit repeatedly during a period of more than 2 days after the procedure. Get help right away if: Your urine is dark red or has blood clots in it. You are leaking urine (have incontinence). The end of the stent comes out of your urethra. You cannot urinate. You have sudden, sharp, or severe pain in your abdomen or lower back. You have a fever. You have swelling or pain in your legs. You have difficulty breathing. Summary After the procedure, it is common to have mild pain when you urinate that goes away within a few minutes after you urinate. This may last for up to 1 week. Watch for any blood in your urine. Call your health care provider if the amount of blood in your urine increases. Take over-the-counter and prescription medicines only as told by your health care provider. Drink enough fluid to keep your urine pale yellow. This information is not intended to replace advice given to you by your health care provider. Make sure you discuss any questions you have with your health care provider. Document Revised: 05/14/2018 Document Reviewed: 05/15/2018 Elsevier Patient Education  National.     Ureteral Stent Implantation, Care After This sheet gives you information about how to care for yourself after your procedure. Your health care provider may also give you more specific instructions. If you have problems or questions, contact your health care provider. What can I expect after the procedure? After the procedure, it is common to have: Nausea. Mild pain when you urinate. You may feel this pain in your lower back or lower abdomen. The pain should stop within a few minutes after you urinate. This may last for up to 1 week. A small amount of blood in your urine for several days. Follow these instructions at home: Medicines Take over-the-counter  and prescription medicines only as told by your health care provider. If you were prescribed an antibiotic medicine, take it as told by your health care provider. Do not stop taking the antibiotic even if you start to feel better. Do not drive for 24 hours if you were given a sedative during your procedure. Ask your health care provider if the medicine prescribed to you requires you to avoid driving or using heavy machinery. Activity Rest as told by your health care provider. Avoid sitting for a long time without moving. Get up to take short walks every 1-2 hours. This is important to improve blood flow and breathing. Ask for help if you feel weak or unsteady. Return to your normal activities as told by your health care provider. Ask your health care provider what activities are safe for you. General instructions Watch for any blood in your urine. Call your health care provider if the amount of blood in your urine increases. If you have a catheter: Follow instructions from your health care provider about taking care of your catheter and collection bag. Do not take baths, swim, or use a hot tub until your health care provider approves. Ask your health care provider if you may take showers. You may only be allowed to take sponge baths. Drink enough fluid to keep your urine pale yellow. Do not use any products that contain nicotine or tobacco, such as cigarettes, e-cigarettes, and chewing tobacco. These can delay healing after surgery. If you need help quitting, ask your health care provider. Keep all follow-up visits as told by your health care provider. This is important. Contact a health care provider if: You have pain that gets worse or does not get better with medicine, especially pain when you urinate. You have difficulty urinating. You feel nauseous or you vomit repeatedly during a period of more than 2 days after the procedure. Get help right away if: Your urine is dark red or has blood  clots in it. You are leaking urine (have incontinence). The end of the stent comes out of your urethra. You cannot urinate. You have sudden, sharp, or severe pain in your abdomen or lower back. You have a fever. You have swelling or pain in your legs. You have difficulty breathing. Summary After the procedure, it is common to have mild pain when you urinate that goes away within a few minutes after you urinate. This may last for up to 1 week. Watch for any blood in your urine. Call your health care provider if the amount of blood in your urine increases. Take over-the-counter and prescription medicines only as told by your health care provider. Drink enough fluid to keep your urine pale yellow. This information is not intended to replace advice given to you by your health care provider. Make  sure you discuss any questions you have with your health care provider. Document Revised: 05/14/2018 Document Reviewed: 05/15/2018 Elsevier Patient Education  2022 [place].     Ureteral Stent Implantation, Care After This sheet gives you information about how to care for yourself after your procedure. Your health care provider may also give you more specific instructions. If you have problems or questions, contact your health care provider. What can I expect after the procedure? After the procedure, it is common to have: Nausea. Mild pain when you urinate. You may feel this pain in your lower back or lower abdomen. The pain should stop within a few minutes after you urinate. This may last for up to 1 week. A small amount of blood in your urine for several days. Follow these instructions at home: Medicines Take over-the-counter and prescription medicines only as told by your health care provider. If you were prescribed an antibiotic medicine, take it as told by your health care provider. Do not stop taking the antibiotic even if you start to feel better. Do not drive for 24 hours if you were  given a sedative during your procedure. Ask your health care provider if the medicine prescribed to you requires you to avoid driving or using heavy machinery. Activity Rest as told by your health care provider. Avoid sitting for a long time without moving. Get up to take short walks every 1-2 hours. This is important to improve blood flow and breathing. Ask for help if you feel weak or unsteady. Return to your normal activities as told by your health care provider. Ask your health care provider what activities are safe for you. General instructions Watch for any blood in your urine. Call your health care provider if the amount of blood in your urine increases. If you have a catheter: Follow instructions from your health care provider about taking care of your catheter and collection bag. Do not take baths, swim, or use a hot tub until your health care provider approves. Ask your health care provider if you may take showers. You may only be allowed to take sponge baths. Drink enough fluid to keep your urine pale yellow. Do not use any products that contain nicotine or tobacco, such as cigarettes, e-cigarettes, and chewing tobacco. These can delay healing after surgery. If you need help quitting, ask your health care provider. Keep all follow-up visits as told by your health care provider. This is important. Contact a health care provider if: You have pain that gets worse or does not get better with medicine, especially pain when you urinate. You have difficulty urinating. You feel nauseous or you vomit repeatedly during a period of more than 2 days after the procedure. Get help right away if: Your urine is dark red or has blood clots in it. You are leaking urine (have incontinence). The end of the stent comes out of your urethra. You cannot urinate. You have sudden, sharp, or severe pain in your abdomen or lower back. You have a fever. You have swelling or pain in your legs. You have  difficulty breathing. Summary After the procedure, it is common to have mild pain when you urinate that goes away within a few minutes after you urinate. This may last for up to 1 week. Watch for any blood in your urine. Call your health care provider if the amount of blood in your urine increases. Take over-the-counter and prescription medicines only as told by your health care provider. Drink enough fluid to  keep your urine pale yellow. This information is not intended to replace advice given to you by your health care provider. Make sure you discuss any questions you have with your health care provider. Document Revised: 05/14/2018 Document Reviewed: 05/15/2018 Elsevier Patient Education  2022 [place].     Ureteral Stent Implantation, Care After This sheet gives you information about how to care for yourself after your procedure. Your health care provider may also give you more specific instructions. If you have problems or questions, contact your health care provider. What can I expect after the procedure? After the procedure, it is common to have: Nausea. Mild pain when you urinate. You may feel this pain in your lower back or lower abdomen. The pain should stop within a few minutes after you urinate. This may last for up to 1 week. A small amount of blood in your urine for several days. Follow these instructions at home: Medicines Take over-the-counter and prescription medicines only as told by your health care provider. If you were prescribed an antibiotic medicine, take it as told by your health care provider. Do not stop taking the antibiotic even if you start to feel better. Do not drive for 24 hours if you were given a sedative during your procedure. Ask your health care provider if the medicine prescribed to you requires you to avoid driving or using heavy machinery. Activity Rest as told by your health care provider. Avoid sitting for a long time without moving. Get up to  take short walks every 1-2 hours. This is important to improve blood flow and breathing. Ask for help if you feel weak or unsteady. Return to your normal activities as told by your health care provider. Ask your health care provider what activities are safe for you. General instructions Watch for any blood in your urine. Call your health care provider if the amount of blood in your urine increases. If you have a catheter: Follow instructions from your health care provider about taking care of your catheter and collection bag. Do not take baths, swim, or use a hot tub until your health care provider approves. Ask your health care provider if you may take showers. You may only be allowed to take sponge baths. Drink enough fluid to keep your urine pale yellow. Do not use any products that contain nicotine or tobacco, such as cigarettes, e-cigarettes, and chewing tobacco. These can delay healing after surgery. If you need help quitting, ask your health care provider. Keep all follow-up visits as told by your health care provider. This is important. Contact a health care provider if: You have pain that gets worse or does not get better with medicine, especially pain when you urinate. You have difficulty urinating. You feel nauseous or you vomit repeatedly during a period of more than 2 days after the procedure. Get help right away if: Your urine is dark red or has blood clots in it. You are leaking urine (have incontinence). The end of the stent comes out of your urethra. You cannot urinate. You have sudden, sharp, or severe pain in your abdomen or lower back. You have a fever. You have swelling or pain in your legs. You have difficulty breathing. Summary After the procedure, it is common to have mild pain when you urinate that goes away within a few minutes after you urinate. This may last for up to 1 week. Watch for any blood in your urine. Call your health care provider if the amount of blood  in your urine increases. Take over-the-counter and prescription medicines only as told by your health care provider. Drink enough fluid to keep your urine pale yellow. This information is not intended to replace advice given to you by your health care provider. Make sure you discuss any questions you have with your health care provider. Document Revised: 05/14/2018 Document Reviewed: 05/15/2018 Elsevier Patient Education  2022 [place].  Ureteral Stent Implantation, Care After This sheet gives you information about how to care for yourself after your procedure. Your health care provider may also give you more specific instructions. If you have problems or questions, contact your health care provider. What can I expect after the procedure? After the procedure, it is common to have: Nausea. Mild pain when you urinate. You may feel this pain in your lower back or lower abdomen. The pain should stop within a few minutes after you urinate. This may last for up to 1 week. A small amount of blood in your urine for several days. Follow these instructions at home: Medicines Take over-the-counter and prescription medicines only as told by your health care provider. If you were prescribed an antibiotic medicine, take it as told by your health care provider. Do not stop taking the antibiotic even if you start to feel better. Do not drive for 24 hours if you were given a sedative during your procedure. Ask your health care provider if the medicine prescribed to you requires you to avoid driving or using heavy machinery. Activity Rest as told by your health care provider. Avoid sitting for a long time without moving. Get up to take short walks every 1-2 hours. This is important to improve blood flow and breathing. Ask for help if you feel weak or unsteady. Return to your normal activities as told by your health care provider. Ask your health care provider what activities are safe for you. General  instructions Watch for any blood in your urine. Call your health care provider if the amount of blood in your urine increases. If you have a catheter: Follow instructions from your health care provider about taking care of your catheter and collection bag. Do not take baths, swim, or use a hot tub until your health care provider approves. Ask your health care provider if you may take showers. You may only be allowed to take sponge baths. Drink enough fluid to keep your urine pale yellow. Do not use any products that contain nicotine or tobacco, such as cigarettes, e-cigarettes, and chewing tobacco. These can delay healing after surgery. If you need help quitting, ask your health care provider. Keep all follow-up visits as told by your health care provider. This is important. Contact a health care provider if: You have pain that gets worse or does not get better with medicine, especially pain when you urinate. You have difficulty urinating. You feel nauseous or you vomit repeatedly during a period of more than 2 days after the procedure. Get help right away if: Your urine is dark red or has blood clots in it. You are leaking urine (have incontinence). The end of the stent comes out of your urethra. You cannot urinate. You have sudden, sharp, or severe pain in your abdomen or lower back. You have a fever. You have swelling or pain in your legs. You have difficulty breathing. Summary After the procedure, it is common to have mild pain when you urinate that goes away within a few minutes after you urinate. This may last for up to 1  week. Watch for any blood in your urine. Call your health care provider if the amount of blood in your urine increases. Take over-the-counter and prescription medicines only as told by your health care provider. Drink enough fluid to keep your urine pale yellow. This information is not intended to replace advice given to you by your health care provider. Make sure  you discuss any questions you have with your health care provider. Document Revised: 05/14/2018 Document Reviewed: 05/15/2018 Elsevier Patient Education  2022 [place]   The drugs that you were given will stay in your system until tomorrow so for the next 24 hours you should not:  Drive an automobile Make any legal decisions Drink any alcoholic beverage   You may resume regular meals tomorrow.  Today it is better to start with liquids and gradually work up to solid foods.  You may eat anything you prefer, but it is better to start with liquids, then soup and crackers, and gradually work up to solid foods.   Please notify your doctor immediately if you have any unusual bleeding, trouble breathing, redness and pain at the surgery site, drainage, fever, or pain not relieved by medication.    Additional Instructions:    Please contact your physician with any problems or Same Day Surgery at [phone], Monday through Friday 6 am to 4 pm, or [place] at [facility] number at [phone].

## 2021-08-18 NOTE — H&P (Signed)
Date of Initial H&P: 08/11/21  History reviewed, patient examined, no change in status, stable for surgery.

## 2021-08-18 NOTE — Op Note (Signed)
Preoperative diagnosis: 1.  Left ureterolithiasis (N20.1)                                           2.  Hematuria (R31.0)  Postoperative diagnosis: Same  Procedure: 1.  Left ureteroscopic ureterolithotomy with holmium laser lithotripsy (CPT 52353)                     2.  Left double-pigtail ureteral stent placement (CPT 32440)                     3.  Fluoroscopy ( CPT 76000)  Surgeon: Otelia Limes. Yves Dill MD  Anesthesia: General  Indications:See the history and physical. After informed consent the above procedure(s) were requested     Technique and findings: After adequate general anesthesia been obtained patient was placed into dorsal lithotomy position and perineum was prepped and draped in usual fashion.  Fluoroscopy showed 4 stones in the distal ureter measuring up to 5 mm in size.  The 21 French cystoscope was then coupled to the camera and visually advanced into the bladder.  Lateral lobe prostatic hypertrophy was noted.  No bladder tumors were identified.  Both ureteral orifices were identified and had clear efflux.  A 0.035 guidewire was then advanced into the left orifice beyond the stones with fluoroscopic guidance.  The cystoscope was then removed taking care leave the guidewire in position.  The short mini semirigid ureteroscope was then coupled to the camera and then advanced into the distal ureter.  The stones were identified.  The 69 m holmium laser fiber was then passed to the level of the most distal stone and it was pulverized using the dust setting.  The other 3 stones were handled in an identical fashion.  The ureteroscope was then removed taking care leave the guidewire in position.  Cystoscope was then backloaded over the guidewire and a 6 x 26 cm double-pigtail ureteral stent with retrieval suture was advanced up the guidewire and positioned in the ureter under fluoroscopic guidance.  The guidewire was then removed taking care to leave the stent in place.  The bladder was then  drained and cystoscope was removed.  10 cc of viscous Xylocaine was instilled within the urethra.  Blood loss was minimal.  Procedure was then terminated and patient transferred to the recovery room in stable condition.

## 2021-08-18 NOTE — Anesthesia Postprocedure Evaluation (Signed)
Anesthesia Post Note  Patient: Adam Hanna.  Procedure(s) Performed: CYSTOSCOPY/URETEROSCOPY/HOLMIUM LASER/STENT PLACEMENT (Left) CYSTOSCOPY WITH RETROGRADE PYELOGRAM  Patient location during evaluation: PACU Anesthesia Type: General Level of consciousness: awake and alert, awake and oriented Pain management: pain level controlled Vital Signs Assessment: post-procedure vital signs reviewed and stable Respiratory status: spontaneous breathing, nonlabored ventilation and respiratory function stable Cardiovascular status: blood pressure returned to baseline and stable Postop Assessment: no apparent nausea or vomiting Anesthetic complications: no   No notable events documented.   Last Vitals:  Vitals:   08/18/21 1132 08/18/21 1141  BP: (!) 171/99 (!) 192/91  Pulse: (!) 58 (!) 59  Resp: 12 18  Temp: (!) 36.2 C (!) 36.3 C  SpO2: 99% 98%    Last Pain:  Vitals:   08/18/21 1141  TempSrc:   PainSc: 0-No pain                 Phill Mutter

## 2021-08-23 ENCOUNTER — Ambulatory Visit: Payer: Self-pay

## 2021-08-23 NOTE — Telephone Encounter (Signed)
Chief Complaint: Diarrhea Symptoms: dry mouth, black stools this afternoon Frequency: Started on 08/19/21 after surgery, 4-5 times today Pertinent Negatives: Patient denies abdominal pain, denies no urine output Disposition: [] ED /[] Urgent Care (no appt availability in office) / [x] Appointment(In office/virtual)/ []  [facility]/ [] Home Care/ [] Refused Recommended Disposition /[] [facility]/ []  Follow-up with PCP Additional Notes: N/A    Summary: diarrhea after surgery   Pt called saying he has had diarrhea since he had surgery last Thursday with [facility].  They told hm to call his primary.  She had a toasted cheese sandwich this morning and he has had diarrhea really bad since.  Before today it wasn't that bad.   CB#  [phone]      Reason for Disposition  [1] MODERATE diarrhea (e.g., 4-6 times / day more than normal) AND [2] age > 70 years  Answer Assessment - Initial Assessment Questions 1. DIARRHEA SEVERITY: "How bad is the diarrhea?" "How many more stools have you had in the past 24 hours than normal?"    - NO DIARRHEA (SCALE 0)   - MILD (SCALE 1-3): Few loose or mushy BMs; increase of 1-3 stools over normal daily number of stools; mild increase in ostomy output.   -  MODERATE (SCALE 4-7): Increase of 4-6 stools daily over normal; moderate increase in ostomy output. * SEVERE (SCALE 8-10; OR 'WORST POSSIBLE'): Increase of 7 or more stools daily over normal; moderate increase in ostomy output; incontinence.     4-5 times 2. ONSET: "When did the diarrhea begin?"      Friday 3. BM CONSISTENCY: "How loose or watery is the diarrhea?"      Loose, black in color 4. VOMITING: "Are you also vomiting?" If Yes, ask: "How many times in the past 24 hours?"      No 5. ABDOMINAL PAIN: "Are you having any abdominal pain?" If Yes, ask: "What does it feel like?" (e.g., crampy, dull, intermittent, constant)      No 6. ABDOMINAL PAIN SEVERITY: If present, ask: "How bad is  the pain?"  (e.g., Scale 1-10; mild, moderate, or severe)   - MILD (1-3): doesn't interfere with normal activities, abdomen soft and not tender to touch    - MODERATE (4-7): interferes with normal activities or awakens from sleep, abdomen tender to touch    - SEVERE (8-10): excruciating pain, doubled over, unable to do any normal activities       N/A 7. ORAL INTAKE: If vomiting, "Have you been able to drink liquids?" "How much liquids have you had in the past 24 hours?"     N/A 8. HYDRATION: "Any signs of dehydration?" (e.g., dry mouth [not just dry lips], too weak to stand, dizziness, new weight loss) "When did you last urinate?"     Dry mouth, last urination 2 hours ago, drinking very little 9. EXPOSURE: "Have you traveled to a foreign country recently?" "Have you been exposed to anyone with diarrhea?" "Could you have eaten any food that was spoiled?"     N/A 10. ANTIBIOTIC USE: "Are you taking antibiotics now or have you taken antibiotics in the past 2 months?"       Yes in the hospital 11. OTHER SYMPTOMS: "Do you have any other symptoms?" (e.g., fever, blood in stool)       Blood in stool (black) 12. PREGNANCY: "Is there any chance you are pregnant?" "When was your last menstrual period?"       N/A  Protocols used: [facility]

## 2021-08-24 ENCOUNTER — Telehealth: Payer: BC Managed Care – PPO | Admitting: Family Medicine

## 2021-08-31 DIAGNOSIS — R338 Other retention of urine: Secondary | ICD-10-CM | POA: Diagnosis not present

## 2021-08-31 DIAGNOSIS — N401 Enlarged prostate with lower urinary tract symptoms: Secondary | ICD-10-CM | POA: Diagnosis not present

## 2021-09-05 ENCOUNTER — Emergency Department: Payer: Medicare Other

## 2021-09-05 ENCOUNTER — Other Ambulatory Visit: Payer: Self-pay

## 2021-09-05 ENCOUNTER — Emergency Department
Admission: EM | Admit: 2021-09-05 | Discharge: 2021-09-05 | Disposition: A | Discharge status: Home / Self Care | Payer: Medicare Other | Attending: Emergency Medicine | Admitting: Emergency Medicine

## 2021-09-05 ENCOUNTER — Ambulatory Visit: Payer: Medicare Other | Admitting: Dermatology

## 2021-09-05 DIAGNOSIS — E860 Dehydration: Secondary | ICD-10-CM | POA: Diagnosis not present

## 2021-09-05 DIAGNOSIS — R55 Syncope and collapse: Secondary | ICD-10-CM | POA: Insufficient documentation

## 2021-09-05 DIAGNOSIS — I10 Essential (primary) hypertension: Secondary | ICD-10-CM | POA: Insufficient documentation

## 2021-09-05 DIAGNOSIS — Z743 Need for continuous supervision: Secondary | ICD-10-CM | POA: Diagnosis not present

## 2021-09-05 DIAGNOSIS — R4020 Unspecified coma: Secondary | ICD-10-CM | POA: Diagnosis not present

## 2021-09-05 DIAGNOSIS — N179 Acute kidney failure, unspecified: Secondary | ICD-10-CM | POA: Insufficient documentation

## 2021-09-05 DIAGNOSIS — R338 Other retention of urine: Secondary | ICD-10-CM | POA: Diagnosis not present

## 2021-09-05 LAB — BASIC METABOLIC PANEL
Anion gap: 5 (ref 5–15)
BUN: 29 mg/dL — ABNORMAL HIGH (ref 8–23)
CO2: 27 mmol/L (ref 22–32)
Calcium: 8 mg/dL — ABNORMAL LOW (ref 8.9–10.3)
Chloride: 104 mmol/L (ref 98–111)
Creatinine, Ser: 1.74 mg/dL — ABNORMAL HIGH (ref 0.61–1.24)
GFR, Estimated: 36 mL/min — ABNORMAL LOW (ref >60)
Glucose, Bld: 101 mg/dL — ABNORMAL HIGH (ref 70–99)
Potassium: 4.4 mmol/L (ref 3.5–5.1)
Sodium: 136 mmol/L (ref 135–145)

## 2021-09-05 LAB — CBC
HCT: 38.8 % — ABNORMAL LOW (ref 39.0–52.0)
Hemoglobin: 12.6 g/dL — ABNORMAL LOW (ref 13.0–17.0)
MCH: 29.9 pg (ref 26.0–34.0)
MCHC: 32.5 g/dL (ref 30.0–36.0)
MCV: 91.9 fL (ref 80.0–100.0)
Platelets: 251 10*3/uL (ref 150–400)
RBC: 4.22 MIL/uL (ref 4.22–5.81)
RDW: 13.2 % (ref 11.5–15.5)
WBC: 7.5 10*3/uL (ref 4.0–10.5)
nRBC: 0 % (ref 0.0–0.2)

## 2021-09-05 LAB — CBG MONITORING, ED: Glucose-Capillary: 97 mg/dL (ref 70–99)

## 2021-09-05 NOTE — ED Triage Notes (Signed)
Pt comes into the ED via EMS from urologist office with c/o syncope, pt was sitting when happened, was placed in the floor by staff to prevent any injury. Pt is a/ox4 on arrival.  120/72 138/70 HR68 98%RA [id number] #[id number]

## 2021-09-05 NOTE — Discharge Instructions (Addendum)
As we discussed your work-up in the emergency department is overall reassuring however we would encourage you to drink plenty of fluids and follow-up with your doctor for further work-up and consideration of further monitoring such as a Holter monitor.

## 2021-09-05 NOTE — ED Provider Notes (Signed)
[facility] Provider Note    Event Date/Time   First MD Initiated Contact with Patient 09/05/21 1221     (approximate)  History   Chief Complaint: Loss of Consciousness  HPI  Adam Hanna. is a 88 y.o. 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  Patient has a Foley catheter in place with a leg bag, states he was having some mild leakage she thought from the leg bag went to his doctor today to have this evaluated he was able to fix the leg bag however while in the office patient had a brief syncopal event.  Patient denies any chest pain or shortness of breath.  Denies any fever cough or congestion.  Patient states he has been in the emergency department over 3 hours at this time and is ready to go.  Physical Exam   Triage Vital Signs: ED Triage Vitals  Enc Vitals Group     BP 09/05/21 1002 117/61     Pulse Rate 09/05/21 1002 62     Resp 09/05/21 1002 18     Temp 09/05/21 1002 (!) 97.5 F (36.4 C)     Temp Source 09/05/21 1002 Oral     SpO2 09/05/21 1002 98 %     Weight 09/05/21 1115 201 lb 15.1 oz (91.6 kg)     Height 09/05/21 1115 6' 1.5" (1.867 m)     Head Circumference --      Peak Flow --      Pain Score --      Pain Loc --      Pain Edu? --      Excl. in [place]? --     Most recent vital signs: Vitals:   09/05/21 1002  BP: 117/61  Pulse: 62  Resp: 18  Temp: (!) 97.5 F (36.4 C)  SpO2: 98%    General: Awake, no distress.  CV:  Good peripheral perfusion.  Regular rate and rhythm  Resp:  Normal effort.  Equal breath sounds bilaterally.  Abd:  No distention.  Soft, nontender.  No rebound or guarding.    ED Results / Procedures / Treatments   EKG  EKG viewed and interpreted by myself shows a normal sinus rhythm at 65 bpm with a narrow QRS, normal axis, normal intervals, no concerning ST changes.  No ST  elevation.  RADIOLOGY  I personally reviewed the chest x-ray images, no concerning findings on my evaluation. Radiologist is read the chest x-ray is negative   MEDICATIONS ORDERED IN ED: Medications - No data to display   IMPRESSION / MDM / [place] / ED COURSE  I reviewed the triage vital signs and the nursing notes.  Patient presents emergency department after syncopal episode this morning at his urology office.  Patient states he feels well.  Patient denies any chest pain at any point.  No shortness of breath.  No abdominal pain.  Patient is very upset that he has been here 3 hours.  However after looking at the patient's work-up he appears to have mild acute kidney injury/insufficiency likely dehydration related as the patient admits he has not been drinking as much fluid as he has been told to.  I discussed with the patient IV hydration and also checking for COVID/influenza and adding on a heart enzyme.  Patient states he has been here 3 hours he is not going to wait  any longer and he wants to go home.  I did encourage the patient to drink plenty of fluids at home I also encouraged him to follow-up with her doctor.  Daughter agreeable.  FINAL CLINICAL IMPRESSION(S) / ED DIAGNOSES   Syncope  Rx / DC Orders   PCP follow-up Hydration  Note:  This document was prepared using Dragon voice recognition software and may include unintentional dictation errors.   [name], MD 09/05/21 1234

## 2021-09-06 DIAGNOSIS — N401 Enlarged prostate with lower urinary tract symptoms: Secondary | ICD-10-CM | POA: Diagnosis not present

## 2021-09-06 DIAGNOSIS — N201 Calculus of ureter: Secondary | ICD-10-CM | POA: Diagnosis not present

## 2021-09-06 DIAGNOSIS — R338 Other retention of urine: Secondary | ICD-10-CM | POA: Diagnosis not present

## 2021-09-17 IMAGING — CT CT L SPINE W/O CM
3 series · 10 of 33 positions shown, 12 images · non-contrast
Comparison: None.

CLINICAL DATA: Lumbar radiculopathy. Bilateral lower extremity
weakness, right greater than left.

EXAM:
CT LUMBAR SPINE WITHOUT CONTRAST
TECHNIQUE: Multidetector CT imaging of the lumbar spine was performed without
intravenous contrast administration. Multiplanar CT image
reconstructions were also generated.

[Series 4: l spine soft · axial · 0.35mm/px · z∈[-291,-181]mm · 2 of 120 slices shown, 3 images]
[im 37/120  soft-tissue]
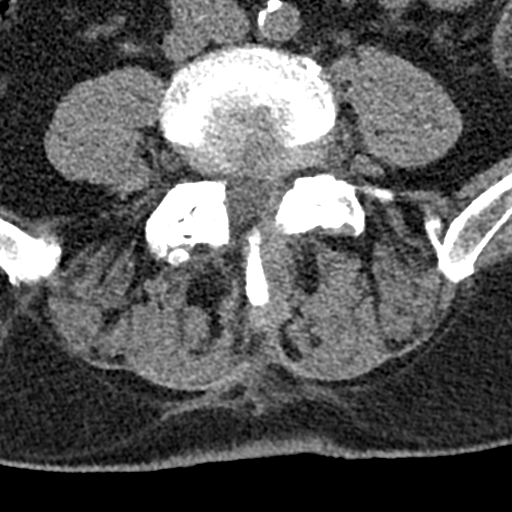
[im 37/120  bone]
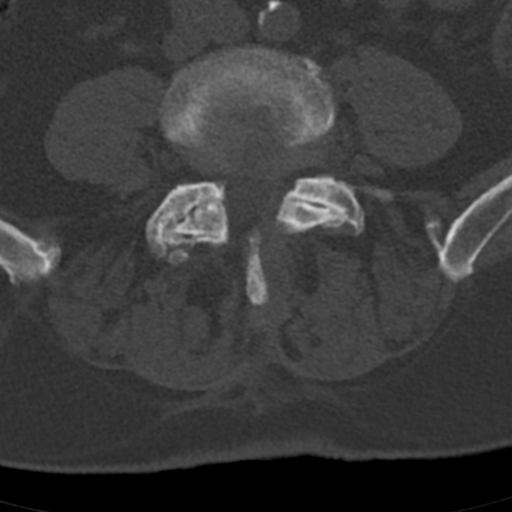
[im 92/120  bone]
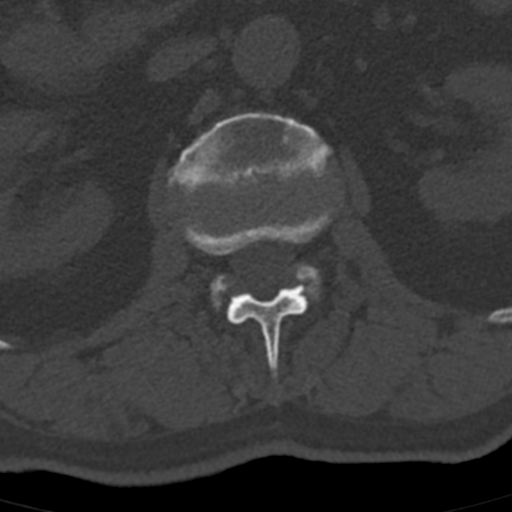

[Series 7: sagittal bone · sagittal · 0.31mm/px · 5 of 55 slices shown, 6 images]
[im 19/55  bone]
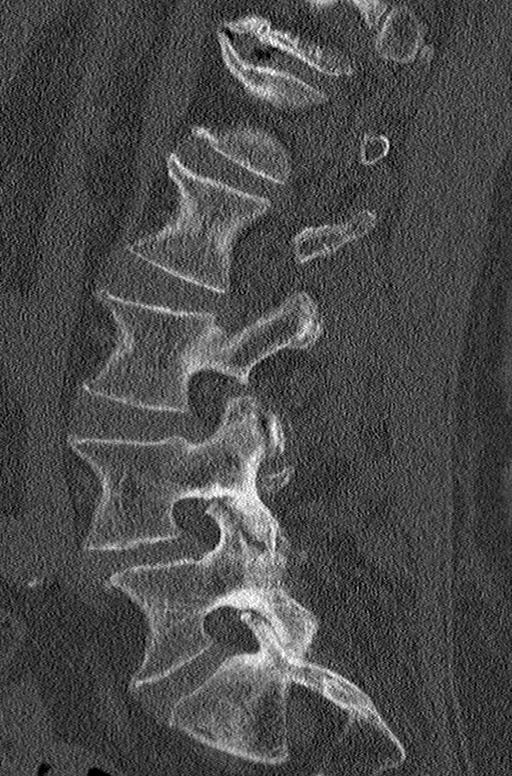
[im 23/55  bone]
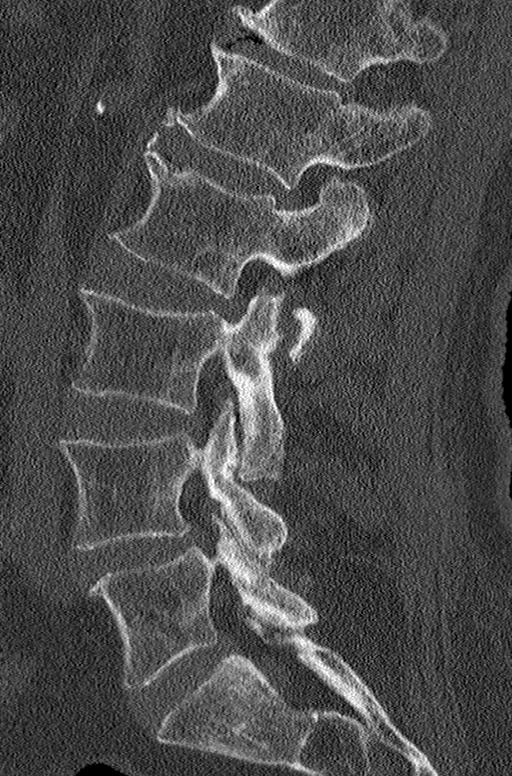
[im 28/55  soft-tissue]
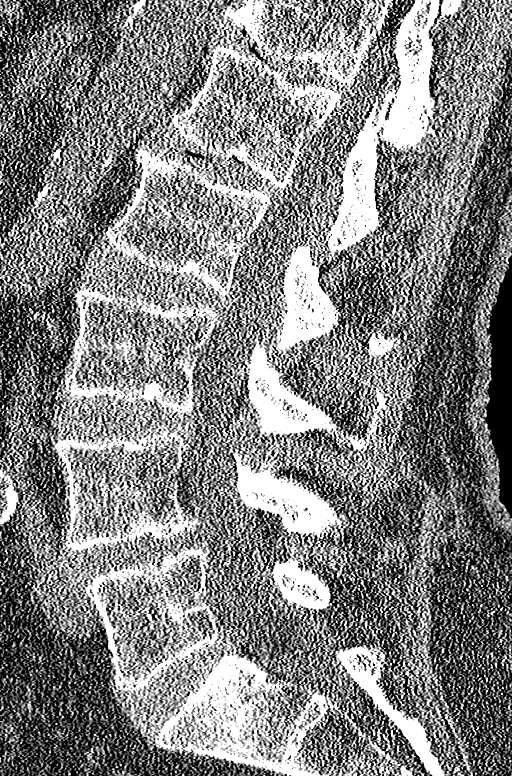
[im 28/55  bone]
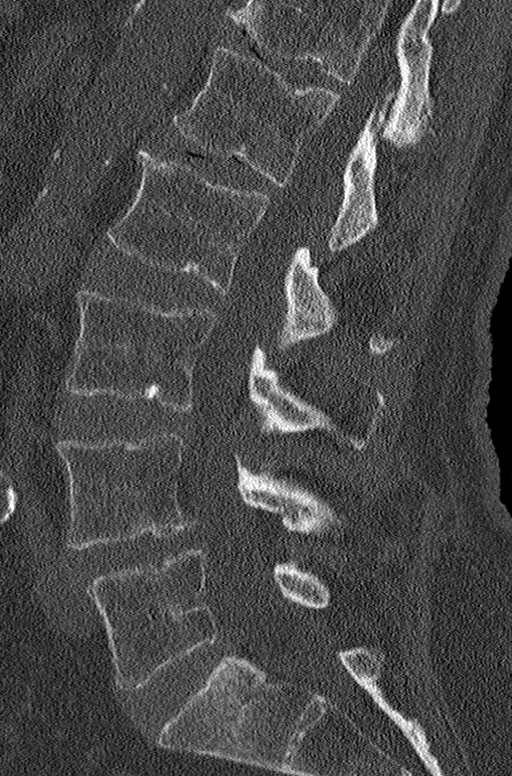
[im 32/55  bone]
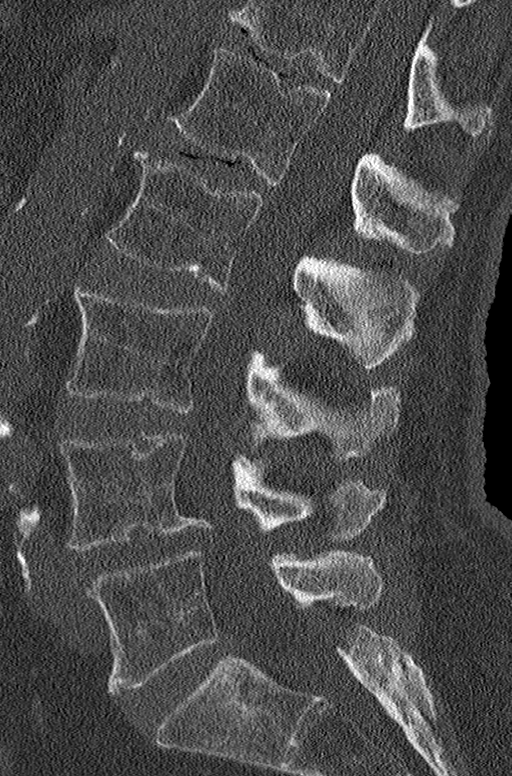
[im 37/55  bone]
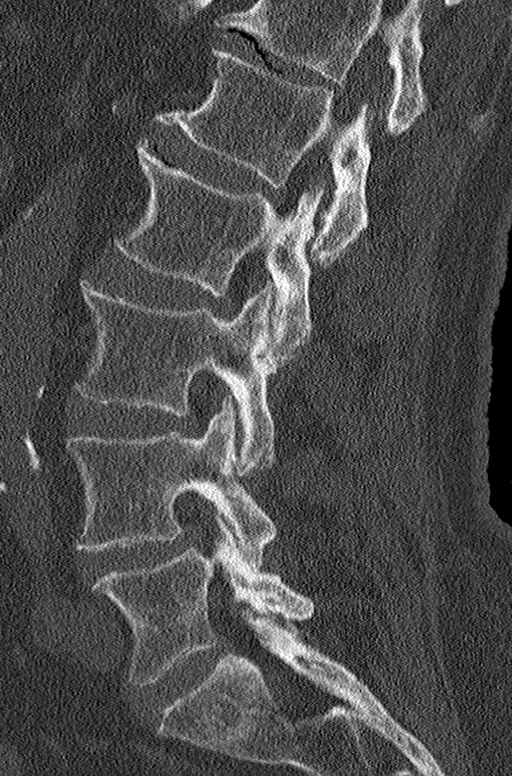

[Series 8: coronal bone · coronal · 0.21mm/px · 3 of 79 slices shown]
[im 16/79  bone]
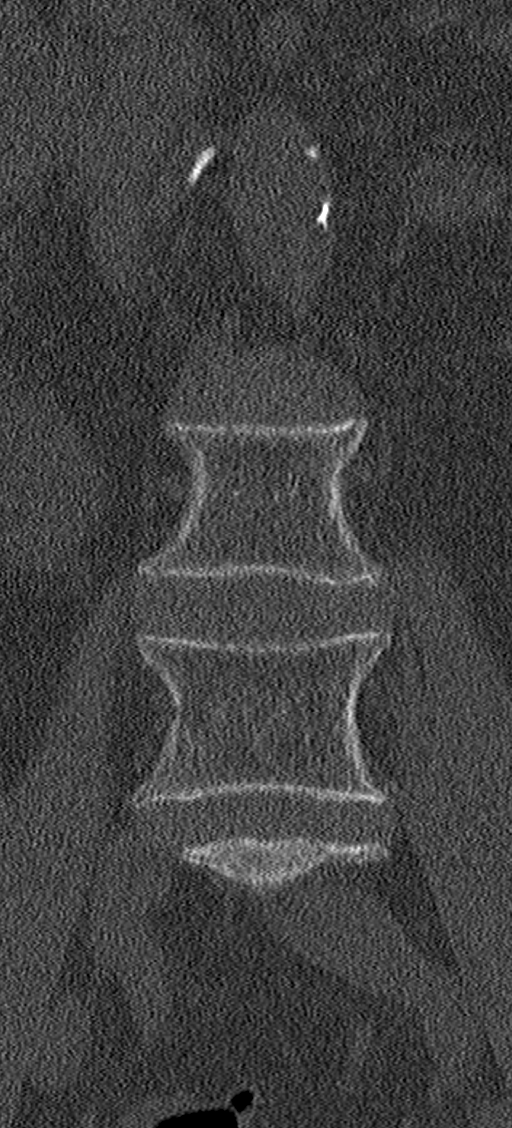
[im 32/79  bone]
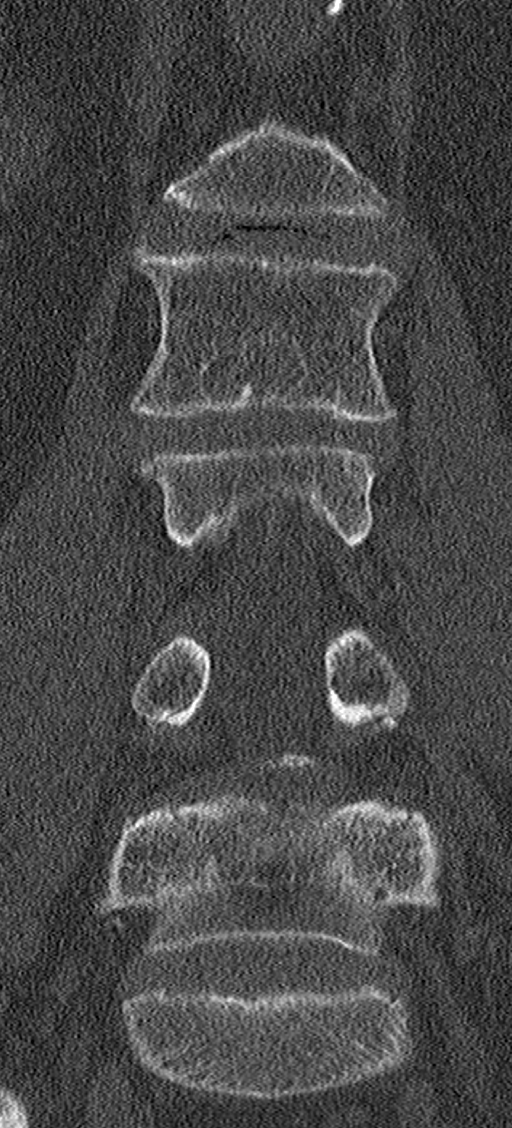
[im 47/79  bone]
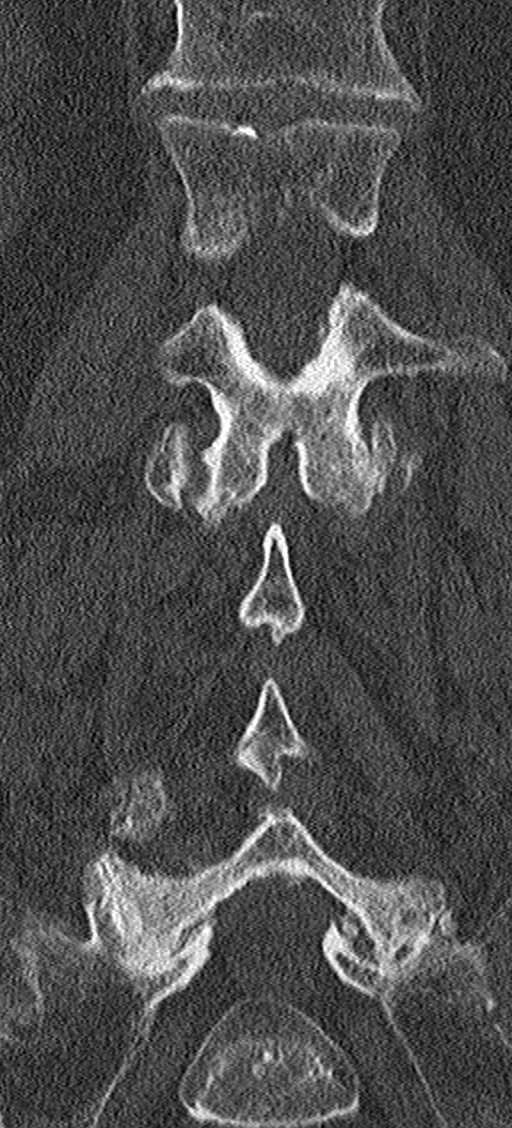

[10 of 33 positions shown; findings below may reference images not displayed]

FINDINGS: Segmentation: 5 lumbar type vertebrae.

Alignment: 3 mm spondylolisthesis of L4 on L5. Normal.

Vertebrae: No acute abnormalities. No focal bone lesions.

Paraspinal and other soft tissues: Aortic atherosclerosis. Focal
dilatation of the abdominal aorta to at least a maximum diameter of
3.7 cm just above the aortic bifurcation. The abdominal aorta is
incompletely visualized at that level. No other significant
abnormalities.

Disc levels: T12-L1: Disc degeneration with a vacuum phenomenon.
Disc bulging or protrusion. Tiny Schmorl's nodes in the vertebral
endplates.

L1-2: Disc degeneration with a vacuum phenomenon. Small disc bulge
into both neural foramina without neural impingement.

L2-3: Small broad-based disc bulge extending into both neural
foramina without neural impingement. Minimal degenerative changes of
the facet joints. Slight compression of the thecal sac
symmetrically. This creates mild spinal stenosis.

L3-4: Tiny broad-based disc bulge with no neural impingement. Slight
bilateral facet arthritis. Mild narrowing of the spinal canal.

L4-5: Prominent broad-based soft disc protrusion asymmetric to the
left of midline with accompanying osteophyte indicating that this is
not acute. Does compress the left side of the thecal sac. The L4
nerves exit without impingement. Previous right laminectomy.
Moderately severe right and moderate left facet arthritis.

L5-S1: No significant disc bulge. No protrusion. Moderately severe
right and moderate left facet arthritis. No neural impingement.
IMPRESSION: 1. Prominent broad-based soft disc protrusion at L4-5 asymmetric to
the left with compression of the left side of the thecal sac. This
is probably not acute.
2. Multilevel degenerative disc disease with mild spinal stenosis at
L2-3 and L3-4.
3. Moderately severe right facet arthritis at L5-S1.
4. Abdominal aortic aneurysm, incompletely visualized. Recommend
followup by ultrasound in 2 years. This recommendation follows ACR
consensus guidelines: White Paper of the ACR Incidental Findings

## 2021-09-22 DIAGNOSIS — R338 Other retention of urine: Secondary | ICD-10-CM | POA: Diagnosis not present

## 2021-09-27 DIAGNOSIS — R338 Other retention of urine: Secondary | ICD-10-CM | POA: Diagnosis not present

## 2021-09-27 DIAGNOSIS — N390 Urinary tract infection, site not specified: Secondary | ICD-10-CM | POA: Diagnosis not present

## 2021-09-27 DIAGNOSIS — N401 Enlarged prostate with lower urinary tract symptoms: Secondary | ICD-10-CM | POA: Diagnosis not present

## 2021-10-04 NOTE — Progress Notes (Signed)
Established patient visit   Patient: Adam Hanna.   DOB: October 18, 1925   88 y.o. Male  MRN: [id number] Visit Date: 10/06/2021  Today's healthcare provider: Wilhemena Durie, MD   No chief complaint on file.  Subjective    HPI  Patient is starting to have progressive problems with mild cognitive impairment.  He is ambulating well but is having urinary incontinence and is having to see urology and is cathing himself 3 times daily. His wife is now almost 87 and they have been married 47 years.  They are both starting to have failing health and they do not have a plan for not living independently. He has had no recent falls.  Hypertension, follow-up  BP Readings from Last 3 Encounters:  10/06/21 123/66  09/05/21 117/61  08/18/21 (!) 192/91   Wt Readings from Last 3 Encounters:  10/06/21 189 lb 14.4 oz (86.1 kg)  09/05/21 201 lb 15.1 oz (91.6 kg)  08/18/21 202 lb (91.6 kg)     He was last seen for hypertension 6 months ago.  BP at that visit was 142/75. Management since that visit includes; Good control on Toprol and telmisartan.  He reports good compliance with treatment. He is not having side effects. none He is following a Low Sodium diet. He is exercising. He does not smoke.   Outside blood pressures are not checked  Pertinent labs: Lab Results  Component Value Date   CHOL 190 10/07/2020   HDL 46 10/07/2020   LDLCALC 123 (H) 10/07/2020   TRIG 114 10/07/2020   CHOLHDL 4.1 10/07/2020   Lab Results  Component Value Date   NA 136 09/05/2021   K 4.4 09/05/2021   CREATININE 1.74 (H) 09/05/2021   GFRNONAA 36 (L) 09/05/2021   GLUCOSE 101 (H) 09/05/2021   TSH 3.560 10/07/2020     The ASCVD Risk score (Arnett DK, et al., 2019) failed to calculate for the following reasons:   The 2019 ASCVD risk score is only valid for ages 71 to 65   The patient has a prior MI or stroke diagnosis     Medications: Outpatient Medications Prior to Visit  Medication  Sig   Hyoscyamine Sulfate SL (LEVSIN/SL) 0.125 MG SUBL Place 0.125 mg under the tongue every 4 (four) hours as needed (bladder spasms). 1-2 TABS   metoprolol succinate (TOPROL-XL) 25 MG 24 hr tablet TAKE 1 TABLET TWICE A DAY (Patient taking differently: 25 mg daily.)   telmisartan (MICARDIS) 40 MG tablet Take 1 tablet (40 mg total) by mouth 2 (two) times daily.   acetaminophen-codeine (TYLENOL #3) 300-30 MG tablet Take 1 tablet by mouth every 4 (four) hours as needed for moderate pain. (Patient not taking: Reported on 10/06/2021)   docusate sodium (COLACE) 100 MG capsule Take 2 capsules (200 mg total) by mouth 2 (two) times daily. (Patient not taking: Reported on 10/06/2021)   isosorbide mononitrate (IMDUR) 30 MG 24 hr tablet Take 30 mg by mouth every morning. (Patient not taking: Reported on 10/06/2021)   lovastatin (MEVACOR) 40 MG tablet Take 40 mg by mouth daily at 6 (six) AM.   nitroGLYCERIN (NITROSTAT) 0.4 MG SL tablet Place 0.4 mg under the tongue every 5 (five) minutes as needed for chest pain. (Patient not taking: Reported on 10/06/2021)   ondansetron (ZOFRAN-ODT) 8 MG disintegrating tablet Take 0.5 tablets (4 mg total) by mouth every 6 (six) hours as needed for nausea or vomiting. (Patient not taking: Reported on 10/06/2021)   pantoprazole (  PROTONIX) 20 MG tablet TAKE 1 TABLET DAILY (Patient taking differently: 20 mg at bedtime.)   sulfamethoxazole-trimethoprim (BACTRIM DS) 800-160 MG tablet Take 1 tablet by mouth 2 (two) times daily.   tamsulosin (FLOMAX) 0.4 MG CAPS capsule Take 0.4 mg by mouth daily. (Patient not taking: Reported on 10/06/2021)   No facility-administered medications prior to visit.    Review of Systems      Objective    BP 123/66 (BP Location: Right Arm, Patient Position: Sitting, Cuff Size: Normal)    Pulse 77    Temp 97.9 F (36.6 C) (Temporal)    Resp 14    Wt 189 lb 14.4 oz (86.1 kg)    SpO2 98%    BMI 24.71 kg/m  BP Readings from Last 3 Encounters:  10/06/21  123/66  09/05/21 117/61  08/18/21 (!) 192/91   Wt Readings from Last 3 Encounters:  10/06/21 189 lb 14.4 oz (86.1 kg)  09/05/21 201 lb 15.1 oz (91.6 kg)  08/18/21 202 lb (91.6 kg)      Physical Exam Vitals reviewed.  Constitutional:      General: He is not in acute distress.    Appearance: Normal appearance. He is well-developed. He is not diaphoretic.  HENT:     Head: Normocephalic and atraumatic.     Right Ear: External ear normal.     Left Ear: External ear normal.     Nose: Nose normal.  Eyes:     General: No scleral icterus.    Conjunctiva/sclera: Conjunctivae normal.  Neck:     Thyroid: No thyromegaly.  Cardiovascular:     Rate and Rhythm: Normal rate and regular rhythm.     Heart sounds: Normal heart sounds. No murmur heard. Pulmonary:     Effort: Pulmonary effort is normal. No respiratory distress.     Breath sounds: Normal breath sounds. No stridor. No rales.  Abdominal:     Palpations: Abdomen is soft.  Musculoskeletal:     Cervical back: Neck supple.  Skin:    General: Skin is warm and dry.  Neurological:     Mental Status: He is alert and oriented to person, place, and time.  Psychiatric:        Mood and Affect: Mood normal.        Behavior: Behavior normal.        Thought Content: Thought content normal.        Judgment: Judgment normal.      No results found for any visits on 10/06/21.  Assessment & Plan     1. MCI (mild cognitive impairment) MMSE is 26/30 today. We discussed his independence and ability to live alone with his wife.  They are going to talk about their options More than 50% 25 minute visit spent in counseling or coordination of care  2. Essential hypertension Good control Follow-up 3 to 4 months. 3. Dyslipidemia On statin  4. Lumbar radiculopathy Clinically stable  5.  Urinary incontinence Self cath 3 times a day and followed by Dr. Yves Dill from urology No follow-ups on file.      I, Wilhemena Durie, MD, have  reviewed all documentation for this visit. The documentation on 10/11/21 for the exam, diagnosis, procedures, and orders are all accurate and complete.    [name] Mon, MD  [facility] [phone] (phone) [phone] (fax)  [place]

## 2021-10-06 ENCOUNTER — Encounter: Payer: Self-pay | Admitting: Family Medicine

## 2021-10-06 ENCOUNTER — Other Ambulatory Visit: Payer: Self-pay

## 2021-10-06 ENCOUNTER — Ambulatory Visit (INDEPENDENT_AMBULATORY_CARE_PROVIDER_SITE_OTHER): Payer: Medicare Other | Admitting: Family Medicine

## 2021-10-06 VITALS — BP 123/66 | HR 77 | Temp 97.9°F | Resp 14 | Wt 189.9 lb

## 2021-10-06 DIAGNOSIS — M5416 Radiculopathy, lumbar region: Secondary | ICD-10-CM

## 2021-10-06 DIAGNOSIS — N3945 Continuous leakage: Secondary | ICD-10-CM

## 2021-10-06 DIAGNOSIS — I10 Essential (primary) hypertension: Secondary | ICD-10-CM

## 2021-10-06 DIAGNOSIS — G3184 Mild cognitive impairment, so stated: Secondary | ICD-10-CM

## 2021-10-06 DIAGNOSIS — E785 Hyperlipidemia, unspecified: Secondary | ICD-10-CM

## 2021-10-12 DIAGNOSIS — N401 Enlarged prostate with lower urinary tract symptoms: Secondary | ICD-10-CM | POA: Diagnosis not present

## 2021-10-12 DIAGNOSIS — R338 Other retention of urine: Secondary | ICD-10-CM | POA: Diagnosis not present

## 2021-11-21 ENCOUNTER — Ambulatory Visit: Payer: Self-pay | Admitting: *Deleted

## 2021-11-21 NOTE — Telephone Encounter (Signed)
Summary: acid issues  ? Pt called in wanting to speak with a nurse because he at some yogurt that had a little bit of fruit in it, and due to his acid issues he wanted to speak with a nurse to see what all types of foods he needs to avoid, Please advise.   ?  ? ?Reason for Disposition ?? Nursing judgment or information in reference ? ?Answer Assessment - Initial Assessment Questions ?1. REASON FOR CALL: "What is your main concern right now?" ?    Changes in stool- patient is reporting he has had changes in stool- very acidic and burns at rectum for the past 2 days. Patient states this is a change for him. Patient states no diarrhea- but soft, large stools.  ?2. ONSET: "When did the change in stool start?" ?    2-3 days ?3. SEVERITY: "How bad is the rectal burning?" ?    Patient has used Vaseline as barrier and it worked ?4. FEVER: "Do you have a fever?" ?    No  ?5. OTHER SYMPTOMS: "Do you have any other new symptoms?" ?    no other symptoms- just acidic stools with burning  ?6. TREATMENTS AND RESPONSE: "What have you done so far to try to make this better? What medicines have you used?" ?    No treatments- asking for advise ?7. PREGNANCY: "Is there any chance you are pregnant?" "When was your last menstrual period?" ? ?Patient reports he had yogurt with grapefruit- upset his GI- burning at rectum ? ?Patient advised decrease acid in food- blander diet- avoid spicy food, fatty food, chocolate, mint. Acidic foods and drinks, tomatoes, garlic, onion, carbonated drinks, caffeine, alcohol.  ?Try acid reducing OTC medications as long as safe with other medications- malox,Mylanta ?Keep food diary for triggers over next 2 weeks ?Call back if symptoms do not improve. ?Call sent for further advice ? ?Protocols used: No Guideline Available-[facility] ? ?

## 2021-11-25 ENCOUNTER — Telehealth: Payer: Self-pay | Admitting: *Deleted

## 2021-11-25 NOTE — Chronic Care Management (AMB) (Signed)
?  Care Management  ? ?Note ? ?11/25/2021 ?Name: Brailon Don. MRN: [id number] DOB: [birth year] ? ?Nathanyel Defenbaugh. is a 88 y.o. year old male who is a primary care patient of Jerrol Banana., MD. I reached out to Tomie China. by phone today offer care coordination services.  ? ?Mr. Black was given information about care management services today including:  ?Care management services include personalized support from designated clinical staff supervised by his physician, including individualized plan of care and coordination with other care providers ?24/7 contact phone numbers for assistance for urgent and routine care needs. ?The patient may stop care management services at any time by phone call to the office staff. ? ?Patient did not agree to enrollment in care management services and does not wish to consider at this time. ? ?Follow up plan: ?The care management team is available to follow up with the patient after provider conversation with the patient regarding recommendation for care management engagement and subsequent re-referral to the care management team.  ?Laverda Sorenson  ?Care Guide, Embedded Care Coordination ?[place]  Care Management  ?Direct Dial: [phone] ? ?

## 2021-11-25 NOTE — Chronic Care Management (AMB) (Signed)
?  Care Management  ? ?Outreach Note ? ?11/25/2021 ?Name: Adam Hanna. MRN: [id number] DOB: Oct 16, 1925 ? ?Referred by: Jerrol Banana., MD ?Reason for referral : Care Coordination (Initial outreach to schedule with [facility] ) ? ? ?A telephone outreach was attempted today. The patient was referred to the case management team for assistance with care management and care coordination.  ? ?Follow Up Plan:  ?The care management team will reach out to the patient again over the next 10 days. If patient returns call to provider office, please advise to call [place] at [phone]. ? ?Laverda Sorenson  ?Care Guide, Embedded Care Coordination ?[place]  Care Management  ?Direct Dial: [phone] ? ?

## 2021-12-01 ENCOUNTER — Encounter: Payer: Self-pay | Admitting: Dermatology

## 2021-12-01 ENCOUNTER — Ambulatory Visit (INDEPENDENT_AMBULATORY_CARE_PROVIDER_SITE_OTHER): Payer: Medicare Other | Admitting: Dermatology

## 2021-12-01 DIAGNOSIS — L570 Actinic keratosis: Secondary | ICD-10-CM

## 2021-12-01 DIAGNOSIS — Z85828 Personal history of other malignant neoplasm of skin: Secondary | ICD-10-CM

## 2021-12-01 DIAGNOSIS — Z85820 Personal history of malignant melanoma of skin: Secondary | ICD-10-CM

## 2021-12-01 DIAGNOSIS — L821 Other seborrheic keratosis: Secondary | ICD-10-CM

## 2021-12-01 DIAGNOSIS — D229 Melanocytic nevi, unspecified: Secondary | ICD-10-CM | POA: Diagnosis not present

## 2021-12-01 DIAGNOSIS — L578 Other skin changes due to chronic exposure to nonionizing radiation: Secondary | ICD-10-CM

## 2021-12-01 DIAGNOSIS — L814 Other melanin hyperpigmentation: Secondary | ICD-10-CM | POA: Diagnosis not present

## 2021-12-01 DIAGNOSIS — Z1283 Encounter for screening for malignant neoplasm of skin: Secondary | ICD-10-CM | POA: Diagnosis not present

## 2021-12-01 DIAGNOSIS — L820 Inflamed seborrheic keratosis: Secondary | ICD-10-CM

## 2021-12-01 DIAGNOSIS — D1800 Hemangioma unspecified site: Secondary | ICD-10-CM

## 2021-12-01 NOTE — Progress Notes (Signed)
? ?Follow-Up Visit ?  ?Subjective  ?Adam Hanna. is a 88 y.o. male who presents for the following: Actinic Keratosis (Hx of aks face , scalp and ears, Isks at neck. Patient reports a spot at left upper eyelid that needs to be checked. ). ?The patient presents for Upper Body Skin Exam (UBSE) for skin cancer screening and mole check.  The patient has spots, moles and lesions to be evaluated, some may be new or changing and the patient has concerns that these could be cancer. ? ?The following portions of the chart were reviewed this encounter and updated as appropriate:  Tobacco  Allergies  Meds  Problems  Med Hx  Surg Hx  Fam Hx   ?  ?Review of Systems: No other skin or systemic complaints except as noted in HPI or Assessment and Plan. ? ?Objective  ?Well appearing patient in no apparent distress; mood and affect are within normal limits. ? ?All skin waist up examined. ? ?face x 8 (8) ?Erythematous thin papules/macules with gritty scale.  ? ?Left Upper Eyelid margin x 1 ?Erythematous stuck-on, waxy papule or plaque ? ? ?Assessment & Plan  ?Actinic keratosis (8) ?face x 8 ?Actinic keratoses are precancerous spots that appear secondary to cumulative UV radiation exposure/sun exposure over time. They are chronic with expected duration over 1 year. A portion of actinic keratoses will progress to squamous cell carcinoma of the skin. It is not possible to reliably predict which spots will progress to skin cancer and so treatment is recommended to prevent development of skin cancer. ?Recommend daily broad spectrum sunscreen SPF 30+ to sun-exposed areas, reapply every 2 hours as needed.  ?Recommend staying in the shade or wearing long sleeves, sun glasses (UVA+UVB protection) and wide brim hats (4-inch brim around the entire circumference of the hat). ?Call for new or changing lesions. ? ?Destruction of lesion - face x 8 ?Complexity: simple   ?Destruction method: cryotherapy   ?Informed consent: discussed and  consent obtained   ?Timeout:  patient name, date of birth, surgical site, and procedure verified ?Lesion destroyed using liquid nitrogen: Yes   ?Region frozen until ice ball extended beyond lesion: Yes   ?Outcome: patient tolerated procedure well with no complications   ?Post-procedure details: wound care instructions given   ?Additional details:  Prior to procedure, discussed risks of blister formation, small wound, skin dyspigmentation, or rare scar following cryotherapy. Recommend Vaseline ointment to treated areas while healing. ? ?Inflamed seborrheic keratosis ?Left Upper Eyelid margin x 1 ?Irritated and patient would like treatment. ?Recheck at next follow up ? ?Destruction of lesion - Left Upper Eyelid margin x 1 ?Complexity: simple   ?Destruction method: cryotherapy   ?Informed consent: discussed and consent obtained   ?Timeout:  patient name, date of birth, surgical site, and procedure verified ?Lesion destroyed using liquid nitrogen: Yes   ?Region frozen until ice ball extended beyond lesion: Yes   ?Outcome: patient tolerated procedure well with no complications   ?Post-procedure details: wound care instructions given   ?Additional details:  Prior to procedure, discussed risks of blister formation, small wound, skin dyspigmentation, or rare scar following cryotherapy. Recommend Vaseline ointment to treated areas while healing. ? ?Skin cancer screening ? ?Lentigines ?- Scattered tan macules ?- Due to sun exposure ?- Benign-appearing, observe ?- Recommend daily broad spectrum sunscreen SPF 30+ to sun-exposed areas, reapply every 2 hours as needed. ?- Call for any changes ? ?Seborrheic Keratoses ?- Stuck-on, waxy, tan-brown papules and/or plaques  ?- Benign-appearing ?-  Discussed benign etiology and prognosis. ?- Observe ?- Call for any changes ? ?Melanocytic Nevi ?- Tan-brown and/or pink-flesh-colored symmetric macules and papules ?- Benign appearing on exam today ?- Observation ?- Call clinic for new or  changing moles ?- Recommend daily use of broad spectrum spf 30+ sunscreen to sun-exposed areas.  ? ?Hemangiomas ?- Red papules ?- Discussed benign nature ?- Observe ?- Call for any changes ? ?Actinic Damage ?- Chronic condition, secondary to cumulative UV/sun exposure ?- diffuse scaly erythematous macules with underlying dyspigmentation ?- Recommend daily broad spectrum sunscreen SPF 30+ to sun-exposed areas, reapply every 2 hours as needed.  ?- Staying in the shade or wearing long sleeves, sun glasses (UVA+UVB protection) and wide brim hats (4-inch brim around the entire circumference of the hat) are also recommended for sun protection.  ?- Call for new or changing lesions. ? ?History of Squamous Cell Carcinoma of the Skin ?- No evidence of recurrence today ?- No lymphadenopathy ?- Recommend regular full body skin exams ?- Recommend daily broad spectrum sunscreen SPF 30+ to sun-exposed areas, reapply every 2 hours as needed.  ?- Call if any new or changing lesions are noted between office visits ? ?History of Basal Cell Carcinoma of the Skin ?- No evidence of recurrence today ?- Recommend regular full body skin exams ?- Recommend daily broad spectrum sunscreen SPF 30+ to sun-exposed areas, reapply every 2 hours as needed.  ?- Call if any new or changing lesions are noted between office visits ? ?History of Melanoma ?- No evidence of recurrence today ?- No lymphadenopathy ?- Recommend regular full body skin exams ?- Recommend daily broad spectrum sunscreen SPF 30+ to sun-exposed areas, reapply every 2 hours as needed.  ?- Call if any new or changing lesions are noted between office visits ? ?Skin cancer screening performed today. ?Return for 6 month tbse . ?Garry Heater, CMA, am acting as scribe for Sarina Ser, MD. ?Documentation: I have reviewed the above documentation for accuracy and completeness, and I agree with the above. ? ?Sarina Ser, MD ? ?

## 2021-12-01 NOTE — Patient Instructions (Signed)
Actinic keratoses are precancerous spots that appear secondary to cumulative UV radiation exposure/sun exposure over time. They are chronic with expected duration over 1 year. A portion of actinic keratoses will progress to squamous cell carcinoma of the skin. It is not possible to reliably predict which spots will progress to skin cancer and so treatment is recommended to prevent development of skin cancer. ? ?Recommend daily broad spectrum sunscreen SPF 30+ to sun-exposed areas, reapply every 2 hours as needed.  ?Recommend staying in the shade or wearing long sleeves, sun glasses (UVA+UVB protection) and wide brim hats (4-inch brim around the entire circumference of the hat). ?Call for new or changing lesions.  ? ?Cryotherapy Aftercare ? ?Wash gently with soap and water everyday.   ?Apply Vaseline and Band-Aid daily until healed.  ? ?Melanoma ABCDEs ? ?Melanoma is the most dangerous type of skin cancer, and is the leading cause of death from skin disease.  You are more likely to develop melanoma if you: ?Have light-colored skin, light-colored eyes, or red or blond hair ?Spend a lot of time in the sun ?Tan regularly, either outdoors or in a tanning bed ?Have had blistering sunburns, especially during childhood ?Have a close family member who has had a melanoma ?Have atypical moles or large birthmarks ? ?Early detection of melanoma is key since treatment is typically straightforward and cure rates are extremely high if we catch it early.  ? ?The first sign of melanoma is often a change in a mole or a new dark spot.  The ABCDE system is a way of remembering the signs of melanoma. ? ?A for asymmetry:  The two halves do not match. ?B for border:  The edges of the growth are irregular. ?C for color:  A mixture of colors are present instead of an even brown color. ?D for diameter:  Melanomas are usually (but not always) greater than 65m - the size of a pencil eraser. ?E for evolution:  The spot keeps changing in size,  shape, and color. ? ?Please check your skin once per month between visits. You can use a small mirror in front and a large mirror behind you to keep an eye on the back side or your body.  ? ?If you see any new or changing lesions before your next follow-up, please call to schedule a visit. ? ?Please continue daily skin protection including broad spectrum sunscreen SPF 30+ to sun-exposed areas, reapplying every 2 hours as needed when you're outdoors.  ? ?Staying in the shade or wearing long sleeves, sun glasses (UVA+UVB protection) and wide brim hats (4-inch brim around the entire circumference of the hat) are also recommended for sun protection.   ? ?If You Need Anything After Your Visit ? ?If you have any questions or concerns for your doctor, please call our main line at [phone] press option 4 to reach your doctor's medical assistant. If no one answers, please leave a voicemail as directed and we will return your call as soon as possible. Messages left after 4 pm will be answered the following business day.  ? ?You may also send [place] message via MyChart. We typically respond to MyChart messages within 1-2 business days. ? ?For prescription refills, please ask your pharmacy to contact our office. Our fax number is [phone] ? ?If you have an urgent issue when the clinic is closed that cannot wait until the next business day, you can page your doctor at the number below.   ? ?Please note  that while we do our best to be available for urgent issues outside of office hours, we are not available 24/7.  ? ?If you have an urgent issue and are unable to reach [place], you may choose to seek medical care at your doctor's office, retail clinic, urgent care center, or emergency room. ? ?If you have a medical emergency, please immediately call 911 or go to the emergency department. ? ?Pager Numbers ? ?- Dr. Nehemiah Massed: [phone] ? ?- Dr. Laurence Ferrari: [phone] ? ?- Dr. Nicole Kindred: [phone] ? ?In the event of inclement  weather, please call our main line at [phone] for an update on the status of any delays or closures. ? ?Dermatology Medication Tips: ?Please keep the boxes that topical medications come in in order to help keep track of the instructions about where and how to use these. Pharmacies typically print the medication instructions only on the boxes and not directly on the medication tubes.  ? ?If your medication is too expensive, please contact our office at [phone] option 4 or send [place] a message through [facility].  ? ?We are unable to tell what your co-pay for medications will be in advance as this is different depending on your insurance coverage. However, we may be able to find a substitute medication at lower cost or fill out paperwork to get insurance to cover a needed medication.  ? ?If a prior authorization is required to get your medication covered by your insurance company, please allow [place] 1-2 business days to complete this process. ? ?Drug prices often vary depending on where the prescription is filled and some pharmacies may offer cheaper prices. ? ?The website www.goodrx.com contains coupons for medications through different pharmacies. The prices here do not account for what the cost may be with help from insurance (it may be cheaper with your insurance), but the website can give you the price if you did not use any insurance.  ?- You can print the associated coupon and take it with your prescription to the pharmacy.  ?- You may also stop by our office during regular business hours and pick up a GoodRx coupon card.  ?- If you need your prescription sent electronically to a different pharmacy, notify our office through [facility] or by phone at [phone] option 4. ? ? ? ? ?Si Usted Necesita Algo Despu?s de Su Visita ? ?Tambi?n puede enviarnos un mensaje a trav?s de MyChart. Por lo general respondemos a los mensajes de MyChart en el transcurso de 1 a 2 d?as h?biles. ? ?Para renovar recetas,  por favor pida a su farmacia que se ponga en contacto con nuestra oficina. Nuestro n?mero de fax es el [phone]. ? ?Si tiene un asunto urgente cuando la cl?nica est? cerrada y que no puede esperar hasta el siguiente d?a h?bil, puede llamar/localizar a su doctor(a) al n?mero que aparece a continuaci?n.  ? ?Por favor, tenga en cuenta que aunque hacemos todo lo posible para estar disponibles para asuntos urgentes fuera del horario de oficina, no estamos disponibles las 24 horas del d?a, los 7 d?as de la semana.  ? ?Si tiene un problema urgente y no puede comunicarse con nosotros, puede optar por buscar atenci?n m?dica  en el consultorio de su doctor(a), en una cl?nica privada, en un centro de atenci?n urgente o en una sala de emergencias. ? ?Si tiene Engineer, maintenance (IT) m?dica, por favor llame inmediatamente al 911 o vaya a la sala de emergencias. ? ?N?meros de b?per ? ?- Dr. Nehemiah Massed: [phone] ? ?-  DraLaurence Ferrari: [phone] ? ?- Dra. Nicole Kindred: [phone] ? ?En caso de inclemencias del tiempo, por favor llame a nuestra l?nea principal al [phone] para una actualizaci?n sobre el estado de cualquier retraso o cierre. ? ?Consejos para la medicaci?n en dermatolog?a: ?Por favor, guarde las cajas en las que vienen los medicamentos de uso t?pico para ayudarle a seguir las instrucciones sobre d?nde y c?mo usarlos. Las farmacias generalmente imprimen las instrucciones del medicamento s?lo en las cajas y no directamente en los tubos del [place].  ? ?Si su medicamento es muy caro, por favor, p?ngase en contacto con Zigmund Daniel llamando al [phone] y presione la opci?n 4 o env?enos un mensaje a trav?s de MyChart.  ? ?No podemos decirle cu?l ser? su copago por los medicamentos por adelantado ya que esto es diferente dependiendo de la cobertura de su seguro. Sin embargo, es posible que podamos encontrar un medicamento sustituto a Electrical engineer un formulario para que el seguro cubra el medicamento que se  considera necesario.  ? ?Si se requiere Ardelia Mems autorizaci?n previa para que su compa??a de seguros [place] su medicamento, por favor perm?tanos de 1 a 2 d?as h?biles para completar este proceso. ? ?Los precios de lo

## 2021-12-09 ENCOUNTER — Encounter: Payer: Self-pay | Admitting: Dermatology

## 2021-12-19 ENCOUNTER — Other Ambulatory Visit: Payer: Self-pay | Admitting: Family Medicine

## 2021-12-19 DIAGNOSIS — I10 Essential (primary) hypertension: Secondary | ICD-10-CM

## 2021-12-20 ENCOUNTER — Telehealth: Payer: Self-pay | Admitting: Family Medicine

## 2021-12-20 NOTE — Telephone Encounter (Signed)
Copied from [place] [id number]. Topic: Medicare AWV ?>> Dec 20, 2021  4:02 PM Cher Nakai R wrote: ?Reason for CRM:  ?No answer unable to leave a message for patient to call back and schedule Medicare Annual Wellness Visit (AWV) in office.  ? ?If unable to come into the office for AWV,  please offer to do virtually or by telephone. ? ?Last AWV: 10/05/2020 ? ?Please schedule at anytime with [facility] Health Advisor. ? ?30 minute appointment for Virtual or phone ?45 minute appointment for in office or Initial virtual/phone ? ?Any questions, please contact me at [phone] ?

## 2021-12-29 DIAGNOSIS — N401 Enlarged prostate with lower urinary tract symptoms: Secondary | ICD-10-CM | POA: Diagnosis not present

## 2022-01-04 DIAGNOSIS — R3914 Feeling of incomplete bladder emptying: Secondary | ICD-10-CM | POA: Diagnosis not present

## 2022-01-04 DIAGNOSIS — N401 Enlarged prostate with lower urinary tract symptoms: Secondary | ICD-10-CM | POA: Diagnosis not present

## 2022-02-08 ENCOUNTER — Encounter: Payer: Self-pay | Admitting: Family Medicine

## 2022-02-08 ENCOUNTER — Ambulatory Visit (INDEPENDENT_AMBULATORY_CARE_PROVIDER_SITE_OTHER): Payer: Medicare Other | Admitting: Family Medicine

## 2022-02-08 VITALS — BP 110/72 | HR 66 | Resp 16 | Wt 184.0 lb

## 2022-02-08 DIAGNOSIS — I491 Atrial premature depolarization: Secondary | ICD-10-CM | POA: Diagnosis not present

## 2022-02-08 DIAGNOSIS — G3184 Mild cognitive impairment, so stated: Secondary | ICD-10-CM | POA: Diagnosis not present

## 2022-02-08 DIAGNOSIS — I10 Essential (primary) hypertension: Secondary | ICD-10-CM | POA: Diagnosis not present

## 2022-02-08 DIAGNOSIS — M5416 Radiculopathy, lumbar region: Secondary | ICD-10-CM | POA: Diagnosis not present

## 2022-02-08 DIAGNOSIS — R739 Hyperglycemia, unspecified: Secondary | ICD-10-CM

## 2022-02-08 DIAGNOSIS — K219 Gastro-esophageal reflux disease without esophagitis: Secondary | ICD-10-CM | POA: Diagnosis not present

## 2022-02-08 DIAGNOSIS — R634 Abnormal weight loss: Secondary | ICD-10-CM

## 2022-02-08 DIAGNOSIS — E785 Hyperlipidemia, unspecified: Secondary | ICD-10-CM | POA: Diagnosis not present

## 2022-02-08 NOTE — Progress Notes (Unsigned)
Established patient visit  I,April Miller,acting as a scribe for Wilhemena Durie, MD.,have documented all relevant documentation on the behalf of Wilhemena Durie, MD,as directed by  Wilhemena Durie, MD while in the presence of Wilhemena Durie, MD.   Patient: Adam Hanna.   DOB: [birth year]   88 y.o. Male  MRN: [id number] Visit Date: 02/08/2022  Today's healthcare provider: Wilhemena Durie, MD   Chief Complaint  Patient presents with   Follow-up   Hypertension   MCI   Subjective    HPI  Hypertension, follow-up  BP Readings from Last 3 Encounters:  02/08/22 110/72  10/06/21 123/66  09/05/21 117/61   Wt Readings from Last 3 Encounters:  02/08/22 184 lb (83.5 kg)  10/06/21 189 lb 14.4 oz (86.1 kg)  09/05/21 201 lb 15.1 oz (91.6 kg)     He was last seen for hypertension 4 months ago.  Management since that visit includes; Good control.  Outside blood pressures are not checking.  Pertinent labs Lab Results  Component Value Date   CHOL 190 10/07/2020   HDL 46 10/07/2020   LDLCALC 123 (H) 10/07/2020   TRIG 114 10/07/2020   CHOLHDL 4.1 10/07/2020   Lab Results  Component Value Date   NA 136 09/05/2021   K 4.4 09/05/2021   CREATININE 1.74 (H) 09/05/2021   GFRNONAA 36 (L) 09/05/2021   GLUCOSE 101 (H) 09/05/2021   TSH 3.560 10/07/2020     The ASCVD Risk score (Arnett DK, et al., 2019) failed to calculate for the following reasons:   The 2019 ASCVD risk score is only valid for ages 76 to 37   The patient has a prior MI or stroke diagnosis  --------------------------------------------------------------------------------------------------- Follow up for MCI (mild cognitive impairment):  The patient was last seen for this 4 months ago. Changes made at last visit include; MMSE is 26/30 today. We discussed his independence and ability to live alone with his wife.  They are going to talk about their  options.  -----------------------------------------------------------------------------------------   Medications: Outpatient Medications Prior to Visit  Medication Sig   Hyoscyamine Sulfate SL (LEVSIN/SL) 0.125 MG SUBL Place 0.125 mg under the tongue every 4 (four) hours as needed (bladder spasms). 1-2 TABS   isosorbide mononitrate (IMDUR) 30 MG 24 hr tablet Take 30 mg by mouth every morning.   lovastatin (MEVACOR) 40 MG tablet Take 40 mg by mouth daily at 6 (six) AM.   metoprolol succinate (TOPROL-XL) 25 MG 24 hr tablet TAKE 1 TABLET TWICE A DAY (Patient taking differently: 25 mg daily.)   nitroGLYCERIN (NITROSTAT) 0.4 MG SL tablet Place 0.4 mg under the tongue every 5 (five) minutes as needed for chest pain.   ondansetron (ZOFRAN-ODT) 8 MG disintegrating tablet Take 0.5 tablets (4 mg total) by mouth every 6 (six) hours as needed for nausea or vomiting.   pantoprazole (PROTONIX) 20 MG tablet TAKE 1 TABLET DAILY   tamsulosin (FLOMAX) 0.4 MG CAPS capsule Take 0.4 mg by mouth daily.   telmisartan (MICARDIS) 40 MG tablet TAKE 1 TABLET TWICE A DAY   [DISCONTINUED] acetaminophen-codeine (TYLENOL #3) 300-30 MG tablet Take 1 tablet by mouth every 4 (four) hours as needed for moderate pain. (Patient not taking: Reported on 10/06/2021)   [DISCONTINUED] docusate sodium (COLACE) 100 MG capsule Take 2 capsules (200 mg total) by mouth 2 (two) times daily. (Patient not taking: Reported on 10/06/2021)   [DISCONTINUED] sulfamethoxazole-trimethoprim (BACTRIM DS) 800-160 MG tablet Take 1 tablet by  mouth 2 (two) times daily. (Patient not taking: Reported on 02/08/2022)   No facility-administered medications prior to visit.    Review of Systems  Constitutional:  Negative for appetite change, chills and fever.  Respiratory:  Negative for chest tightness, shortness of breath and wheezing.   Cardiovascular:  Negative for chest pain and palpitations.  Gastrointestinal:  Negative for abdominal pain, nausea and  vomiting.    Last hemoglobin A1c Lab Results  Component Value Date   HGBA1C 5.8 (H) 10/07/2020       Objective    BP 110/72 (BP Location: Left Arm, Patient Position: Sitting, Cuff Size: Normal)   Pulse 66   Resp 16   Wt 184 lb (83.5 kg)   SpO2 96%   BMI 23.95 kg/m  BP Readings from Last 3 Encounters:  02/08/22 110/72  10/06/21 123/66  09/05/21 117/61   Wt Readings from Last 3 Encounters:  02/08/22 184 lb (83.5 kg)  10/06/21 189 lb 14.4 oz (86.1 kg)  09/05/21 201 lb 15.1 oz (91.6 kg)      Physical Exam  ***  No results found for any visits on 02/08/22.  Assessment & Plan     ***  No follow-ups on file.      {provider attestation***:1}   Wilhemena Durie, MD  [facility] [phone] (phone) [phone] (fax)  [place]

## 2022-02-09 ENCOUNTER — Other Ambulatory Visit: Payer: Self-pay

## 2022-02-09 ENCOUNTER — Other Ambulatory Visit: Payer: Self-pay | Admitting: Family Medicine

## 2022-02-09 DIAGNOSIS — I10 Essential (primary) hypertension: Secondary | ICD-10-CM

## 2022-02-09 LAB — COMPREHENSIVE METABOLIC PANEL
ALT: 110 IU/L — ABNORMAL HIGH (ref 0–44)
AST: 45 IU/L — ABNORMAL HIGH (ref 0–40)
Albumin/Globulin Ratio: 1.6 (ref 1.2–2.2)
Albumin: 3.9 g/dL (ref 3.5–4.6)
Alkaline Phosphatase: 646 IU/L — ABNORMAL HIGH (ref 44–121)
BUN/Creatinine Ratio: 20 (ref 10–24)
BUN: 25 mg/dL (ref 10–36)
Bilirubin Total: 0.6 mg/dL (ref 0.0–1.2)
CO2: 24 mmol/L (ref 20–29)
Calcium: 8.6 mg/dL (ref 8.6–10.2)
Chloride: 102 mmol/L (ref 96–106)
Creatinine, Ser: 1.27 mg/dL (ref 0.76–1.27)
Globulin, Total: 2.5 g/dL (ref 1.5–4.5)
Glucose: 97 mg/dL (ref 70–99)
Potassium: 4.8 mmol/L (ref 3.5–5.2)
Sodium: 138 mmol/L (ref 134–144)
Total Protein: 6.4 g/dL (ref 6.0–8.5)
eGFR: 53 mL/min/{1.73_m2} — ABNORMAL LOW (ref >59)

## 2022-02-09 LAB — CBC WITH DIFFERENTIAL/PLATELET
Basophils Absolute: 0.1 10*3/uL (ref 0.0–0.2)
Basos: 1 %
EOS (ABSOLUTE): 0.3 10*3/uL (ref 0.0–0.4)
Eos: 6 %
Hematocrit: 37.3 % — ABNORMAL LOW (ref 37.5–51.0)
Hemoglobin: 12.2 g/dL — ABNORMAL LOW (ref 13.0–17.7)
Immature Grans (Abs): 0 10*3/uL (ref 0.0–0.1)
Immature Granulocytes: 0 %
Lymphocytes Absolute: 1.3 10*3/uL (ref 0.7–3.1)
Lymphs: 27 %
MCH: 29 pg (ref 26.6–33.0)
MCHC: 32.7 g/dL (ref 31.5–35.7)
MCV: 89 fL (ref 79–97)
Monocytes Absolute: 0.5 10*3/uL (ref 0.1–0.9)
Monocytes: 10 %
Neutrophils Absolute: 2.6 10*3/uL (ref 1.4–7.0)
Neutrophils: 56 %
Platelets: 239 10*3/uL (ref 150–450)
RBC: 4.21 x10E6/uL (ref 4.14–5.80)
RDW: 13.4 % (ref 11.6–15.4)
WBC: 4.7 10*3/uL (ref 3.4–10.8)

## 2022-02-09 LAB — LIPID PANEL
Chol/HDL Ratio: 4.1 ratio (ref 0.0–5.0)
Cholesterol, Total: 145 mg/dL (ref 100–199)
HDL: 35 mg/dL — ABNORMAL LOW (ref >39)
LDL Chol Calc (NIH): 89 mg/dL (ref 0–99)
Triglycerides: 116 mg/dL (ref 0–149)
VLDL Cholesterol Cal: 21 mg/dL (ref 5–40)

## 2022-02-09 LAB — TSH: TSH: 1.58 u[IU]/mL (ref 0.450–4.500)

## 2022-02-09 MED ORDER — METOPROLOL SUCCINATE ER 25 MG PO TB24: 25.0000 mg | ORAL_TABLET | Freq: Every day | ORAL | 3 refills | Status: AC

## 2022-02-13 ENCOUNTER — Telehealth: Payer: Self-pay | Admitting: Family Medicine

## 2022-02-17 ENCOUNTER — Telehealth: Payer: Self-pay

## 2022-02-17 DIAGNOSIS — R7989 Other specified abnormal findings of blood chemistry: Secondary | ICD-10-CM

## 2022-02-17 NOTE — Telephone Encounter (Signed)
-----   Message from Jerrol Banana., MD sent at 02/16/2022  1:12 PM EDT ----- Repeat hepatic panel..  If liver functions remain elevated we will need to get right upper quadrant ultrasound.  Please advise patient.

## 2022-02-28 DIAGNOSIS — R7989 Other specified abnormal findings of blood chemistry: Secondary | ICD-10-CM | POA: Diagnosis not present

## 2022-03-03 LAB — PROTEIN ELECTROPHORESIS, SERUM, WITH REFLEX
A/G Ratio: 1.2 (ref 0.7–1.7)
Albumin ELP: 3.7 g/dL (ref 2.9–4.4)
Alpha 1: 0.1 g/dL (ref 0.0–0.4)
Alpha 2: 0.8 g/dL (ref 0.4–1.0)
Beta: 0.9 g/dL (ref 0.7–1.3)
Gamma Globulin: 1.2 g/dL (ref 0.4–1.8)
Globulin, Total: 3 g/dL (ref 2.2–3.9)
Total Protein: 6.7 g/dL (ref 6.0–8.5)

## 2022-03-03 LAB — HEPATIC FUNCTION PANEL
ALT: 8 IU/L (ref 0–44)
AST: 30 IU/L (ref 0–40)
Albumin: 4.2 g/dL (ref 3.6–4.6)
Alkaline Phosphatase: 256 IU/L — ABNORMAL HIGH (ref 44–121)
Bilirubin Total: 0.7 mg/dL (ref 0.0–1.2)
Bilirubin, Direct: 0.28 mg/dL (ref 0.00–0.40)

## 2022-03-22 ENCOUNTER — Other Ambulatory Visit: Payer: Self-pay | Admitting: Family Medicine

## 2022-03-22 DIAGNOSIS — N3945 Continuous leakage: Secondary | ICD-10-CM

## 2022-03-22 DIAGNOSIS — J310 Chronic rhinitis: Secondary | ICD-10-CM

## 2022-03-22 MED ORDER — AZELASTINE HCL 0.1 % NA SOLN: 2.0000 | Freq: Two times a day (BID) | NASAL | 12 refills | Status: AC

## 2022-03-22 MED ORDER — CETIRIZINE HCL 10 MG PO TABS: 10.0000 mg | ORAL_TABLET | Freq: Every day | ORAL | 11 refills | Status: AC

## 2022-03-22 NOTE — Progress Notes (Signed)
Patient with significant rhinitis.  He has failed Flonase.

## 2022-03-24 ENCOUNTER — Other Ambulatory Visit: Payer: Self-pay | Admitting: *Deleted

## 2022-03-24 DIAGNOSIS — R439 Unspecified disturbances of smell and taste: Secondary | ICD-10-CM

## 2022-04-10 DIAGNOSIS — J300 Vasomotor rhinitis: Secondary | ICD-10-CM | POA: Diagnosis not present

## 2022-04-10 DIAGNOSIS — R430 Anosmia: Secondary | ICD-10-CM | POA: Diagnosis not present

## 2022-04-14 ENCOUNTER — Ambulatory Visit: Payer: Self-pay

## 2022-04-14 NOTE — Patient Outreach (Signed)
  Care Coordination   Initial Visit Note   04/14/2022 Name: Adam Hanna. MRN: [id number] DOB: 10/09/1925  Adam Hanna. is a 88 y.o. year old male who sees Jerrol Banana., MD for primary care. I spoke with  Tomie China. by phone today.  What matters to the patients health and wellness today?  The nasal spray for the issue he was having earlier in the month has helped a lot. The patient has no new concerns at this time. Education on how to reach the [facility] for changes or concerns.    Goals Addressed             This Visit's Progress    RNCM: Effective Management of Chronic Conditions       Care Coordination Interventions: Evaluation of current treatment plan related to smell issues the patient was having earlier in the month and patient's adherence to plan as established by provider. 04-14-2022: The patient states that the nasal spray that he has received from the ENT has helped a lot with the drainage he has been experiencing and that instead of 3 times a day he has been taking 2 times a day. Education and support given.  Advised patient to call the [facility] for new concerns, questions, or education needs that the [facility] could assist the patient with in the future. The patient feels he is doing well and does not have any needs at this time for the [facility] to assist with Provided education to patient re: the purpose of the program, the ability to see notes in myChart and the availability for the patient to call for anything the [facility] could assist the patient with related to his health and well being.  Reviewed scheduled/upcoming provider appointments including Next pcp appointment 06-13-2022 at 140 pm The patient sees his pcp on a regular basis and also has specialist he sees.            SDOH assessments and interventions completed:  Yes  SDOH Interventions Today    Flowsheet Row Most Recent Value  SDOH Interventions   Housing Interventions Intervention Not Indicated   Transportation Interventions Intervention Not Indicated        Care Coordination Interventions Activated:  Yes  Care Coordination Interventions:  Yes, provided   Follow up plan: No further intervention required.   Encounter Outcome:  Pt. Visit Completed   Noreene Larsson RN, MSN, [facility] Mobile: [phone]

## 2022-04-14 NOTE — Patient Instructions (Signed)
Visit Information  Thank you for taking time to visit with me today. Please don't hesitate to contact me if I can be of assistance to you.   Following are the goals we discussed today:   Goals Addressed             This Visit's Progress    RNCM: Effective Management of Chronic Conditions       Care Coordination Interventions: Evaluation of current treatment plan related to smell issues the patient was having earlier in the month and patient's adherence to plan as established by provider. 04-14-2022: The patient states that the nasal spray that he has received from the ENT has helped a lot with the drainage he has been experiencing and that instead of 3 times a day he has been taking 2 times a day. Education and support given.  Advised patient to call the [facility] for new concerns, questions, or education needs that the [facility] could assist the patient with in the future. The patient feels he is doing well and does not have any needs at this time for the [facility] to assist with Provided education to patient re: the purpose of the program, the ability to see notes in myChart and the availability for the patient to call for anything the [facility] could assist the patient with related to his health and well being.  Reviewed scheduled/upcoming provider appointments including Next pcp appointment 06-13-2022 at 140 pm The patient sees his pcp on a regular basis and also has specialist he sees.             Please call the care guide team at [phone] if you need to schedule an appointment.   If you are experiencing a Mental Health or [place] or need someone to talk to, please call the Suicide and Crisis Lifeline: 988 call the [place] National Suicide Prevention Lifeline: 7344370622 or TTY: 401-463-4795 TTY 5637338900) to talk to a trained counselor call 1-800-273-TALK (toll free, 24 hour hotline)  Patient verbalizes understanding of instructions and care plan provided today and agrees  to view in [place]. Active MyChart status and patient understanding of how to access instructions and care plan via MyChart confirmed with patient.     No further follow up required: the patient is stable but knows to reach out to the [facility] for assistance with any new needs related to his chronic conditions. Noreene Larsson RN, MSN, CCM Community Care Coordinator [facility] Mobile: [phone]

## 2022-05-08 DIAGNOSIS — K219 Gastro-esophageal reflux disease without esophagitis: Secondary | ICD-10-CM | POA: Diagnosis not present

## 2022-05-08 DIAGNOSIS — J300 Vasomotor rhinitis: Secondary | ICD-10-CM | POA: Diagnosis not present

## 2022-05-10 ENCOUNTER — Other Ambulatory Visit: Payer: Self-pay | Admitting: Family Medicine

## 2022-06-05 DIAGNOSIS — H524 Presbyopia: Secondary | ICD-10-CM | POA: Diagnosis not present

## 2022-06-05 DIAGNOSIS — H43813 Vitreous degeneration, bilateral: Secondary | ICD-10-CM | POA: Diagnosis not present

## 2022-06-07 ENCOUNTER — Ambulatory Visit (INDEPENDENT_AMBULATORY_CARE_PROVIDER_SITE_OTHER): Payer: Medicare Other | Admitting: Dermatology

## 2022-06-07 DIAGNOSIS — D229 Melanocytic nevi, unspecified: Secondary | ICD-10-CM

## 2022-06-07 DIAGNOSIS — Z1283 Encounter for screening for malignant neoplasm of skin: Secondary | ICD-10-CM

## 2022-06-07 DIAGNOSIS — Z8589 Personal history of malignant neoplasm of other organs and systems: Secondary | ICD-10-CM

## 2022-06-07 DIAGNOSIS — L814 Other melanin hyperpigmentation: Secondary | ICD-10-CM | POA: Diagnosis not present

## 2022-06-07 DIAGNOSIS — Z85820 Personal history of malignant melanoma of skin: Secondary | ICD-10-CM

## 2022-06-07 DIAGNOSIS — L578 Other skin changes due to chronic exposure to nonionizing radiation: Secondary | ICD-10-CM | POA: Diagnosis not present

## 2022-06-07 DIAGNOSIS — Z85828 Personal history of other malignant neoplasm of skin: Secondary | ICD-10-CM

## 2022-06-07 DIAGNOSIS — L570 Actinic keratosis: Secondary | ICD-10-CM | POA: Diagnosis not present

## 2022-06-07 DIAGNOSIS — Q825 Congenital non-neoplastic nevus: Secondary | ICD-10-CM

## 2022-06-07 DIAGNOSIS — L821 Other seborrheic keratosis: Secondary | ICD-10-CM

## 2022-06-07 NOTE — Progress Notes (Signed)
Follow-Up Visit   Subjective  Adam Hanna. is a 88 y.o. male who presents for the following: Annual Exam. 6 months f/u hx of Melanoma, hx of BCC, hx of SCC. The patient presents for Total-Body Skin Exam (TBSE) for skin cancer screening and mole check.  The patient has spots, moles and lesions to be evaluated, some may be new or changing and the patient has concerns that these could be cancer.   Wife with patient   The following portions of the chart were reviewed this encounter and updated as appropriate:   Tobacco  Allergies  Meds  Problems  Med Hx  Surg Hx  Fam Hx     Review of Systems:  No other skin or systemic complaints except as noted in HPI or Assessment and Plan.  Objective  Well appearing patient in no apparent distress; mood and affect are within normal limits.  A full examination was performed including scalp, head, eyes, ears, nose, lips, neck, chest, axillae, abdomen, back, buttocks, bilateral upper extremities, bilateral lower extremities, hands, feet, fingers, toes, fingernails, and toenails. All findings within normal limits unless otherwise noted below.  right posterior waistline Brown macule   face x 7, scalp x 1  (8) (8) Erythematous thin papules/macules with gritty scale.    Assessment & Plan  Congenital nevus right posterior waistline Benign-appearing.  Observation.  Call clinic for new or changing moles.  Recommend daily use of broad spectrum spf 30+ sunscreen to sun-exposed areas.    AK (actinic keratosis) (8) face x 7, scalp x 1  (8) Actinic keratoses are precancerous spots that appear secondary to cumulative UV radiation exposure/sun exposure over time. They are chronic with expected duration over 1 year. A portion of actinic keratoses will progress to squamous cell carcinoma of the skin. It is not possible to reliably predict which spots will progress to skin cancer and so treatment is recommended to prevent development of skin  cancer.  Recommend daily broad spectrum sunscreen SPF 30+ to sun-exposed areas, reapply every 2 hours as needed.  Recommend staying in the shade or wearing long sleeves, sun glasses (UVA+UVB protection) and wide brim hats (4-inch brim around the entire circumference of the hat). Call for new or changing lesions.   Destruction of lesion - face x 7, scalp x 1  (8) Complexity: simple   Destruction method: cryotherapy   Informed consent: discussed and consent obtained   Timeout:  patient name, date of birth, surgical site, and procedure verified Lesion destroyed using liquid nitrogen: Yes   Region frozen until ice ball extended beyond lesion: Yes   Outcome: patient tolerated procedure well with no complications   Post-procedure details: wound care instructions given    Lentigines - Scattered tan macules - Due to sun exposure - Benign-appearing, observe - Recommend daily broad spectrum sunscreen SPF 30+ to sun-exposed areas, reapply every 2 hours as needed. - Call for any changes  Seborrheic Keratoses - Stuck-on, waxy, tan-brown papules and/or plaques  - Benign-appearing - Discussed benign etiology and prognosis. - Observe - Call for any changes  Melanocytic Nevi - Tan-brown and/or pink-flesh-colored symmetric macules and papules - Benign appearing on exam today - Observation - Call clinic for new or changing moles - Recommend daily use of broad spectrum spf 30+ sunscreen to sun-exposed areas.   Hemangiomas - Red papules - Discussed benign nature - Observe - Call for any changes  Actinic Damage - Chronic condition, secondary to cumulative UV/sun exposure - diffuse scaly erythematous macules  with underlying dyspigmentation - Recommend daily broad spectrum sunscreen SPF 30+ to sun-exposed areas, reapply every 2 hours as needed.  - Staying in the shade or wearing long sleeves, sun glasses (UVA+UVB protection) and wide brim hats (4-inch brim around the entire circumference of  the hat) are also recommended for sun protection.  - Call for new or changing lesions.  History of Basal Cell Carcinoma of the Skin Multiple see history  - No evidence of recurrence today - Recommend regular full body skin exams - Recommend daily broad spectrum sunscreen SPF 30+ to sun-exposed areas, reapply every 2 hours as needed.  - Call if any new or changing lesions are noted between office visits   History of Squamous Cell Carcinoma of the Skin Multiple see history - No evidence of recurrence today - No lymphadenopathy - Recommend regular full body skin exams - Recommend daily broad spectrum sunscreen SPF 30+ to sun-exposed areas, reapply every 2 hours as needed.  - Call if any new or changing lesions are noted between office visits   History of Melanoma Left post auricular 1991 - No evidence of recurrence today - No lymphadenopathy - Recommend regular full body skin exams - Recommend daily broad spectrum sunscreen SPF 30+ to sun-exposed areas, reapply every 2 hours as needed.  - Call if any new or changing lesions are noted between office visits   Skin cancer screening performed today.   Return in about 6 months (around 12/07/2022) for AKs, 12 months TBSE hx of melanoma .  IMarye Round, CMA, am acting as scribe for Sarina Ser, MD .  Documentation: I have reviewed the above documentation for accuracy and completeness, and I agree with the above.  Sarina Ser, MD

## 2022-06-07 NOTE — Patient Instructions (Addendum)
Cryotherapy Aftercare  Wash gently with soap and water everyday.   Apply Vaseline and Band-Aid daily until healed.     Due to recent changes in healthcare laws, you may see results of your pathology and/or laboratory studies on MyChart before the doctors have had a chance to review them. We understand that in some cases there may be results that are confusing or concerning to you. Please understand that not all results are received at the same time and often the doctors may need to interpret multiple results in order to provide you with the best plan of care or course of treatment. Therefore, we ask that you please give us 2 business days to thoroughly review all your results before contacting the office for clarification. Should we see a critical lab result, you will be contacted sooner.   If You Need Anything After Your Visit  If you have any questions or concerns for your doctor, please call our main line at [phone] and press option 4 to reach your doctor's medical assistant. If no one answers, please leave a voicemail as directed and we will return your call as soon as possible. Messages left after 4 pm will be answered the following business day.   You may also send us a message via MyChart. We typically respond to MyChart messages within 1-2 business days.  For prescription refills, please ask your pharmacy to contact our office. Our fax number is [phone].  If you have an urgent issue when the clinic is closed that cannot wait until the next business day, you can page your doctor at the number below.    Please note that while we do our best to be available for urgent issues outside of office hours, we are not available 24/7.   If you have an urgent issue and are unable to reach us, you may choose to seek medical care at your doctor's office, retail clinic, urgent care center, or emergency room.  If you have a medical emergency, please immediately call 911 or go to the  emergency department.  Pager Numbers  - Dr. Kowalski: [phone]  - Dr. Moye: [phone]  - Dr. Stewart: [phone]  In the event of inclement weather, please call our main line at [phone] for an update on the status of any delays or closures.  Dermatology Medication Tips: Please keep the boxes that topical medications come in in order to help keep track of the instructions about where and how to use these. Pharmacies typically print the medication instructions only on the boxes and not directly on the medication tubes.   If your medication is too expensive, please contact our office at [phone] option 4 or send us a message through MyChart.   We are unable to tell what your co-pay for medications will be in advance as this is different depending on your insurance coverage. However, we may be able to find a substitute medication at lower cost or fill out paperwork to get insurance to cover a needed medication.   If a prior authorization is required to get your medication covered by your insurance company, please allow us 1-2 business days to complete this process.  Drug prices often vary depending on where the prescription is filled and some pharmacies may offer cheaper prices.  The website www.goodrx.com contains coupons for medications through different pharmacies. The prices here do not account for what the cost may be with help from insurance (it may be cheaper with your insurance), but the website can   give you the price if you did not use any insurance.  - You can print the associated coupon and take it with your prescription to the pharmacy.  - You may also stop by our office during regular business hours and pick up a GoodRx coupon card.  - If you need your prescription sent electronically to a different pharmacy, notify our office through [facility] MyChart or by phone at [phone] option 4.     Si Usted Necesita Algo Despus de Su Visita  Tambin puede  enviarnos un mensaje a travs de MyChart. Por lo general respondemos a los mensajes de MyChart en el transcurso de 1 a 2 das hbiles.  Para renovar recetas, por favor pida a su farmacia que se ponga en contacto con nuestra oficina. Nuestro nmero de fax es el [phone].  Si tiene un asunto urgente cuando la clnica est cerrada y que no puede esperar hasta el siguiente da hbil, puede llamar/localizar a su doctor(a) al nmero que aparece a continuacin.   Por favor, tenga en cuenta que aunque hacemos todo lo posible para estar disponibles para asuntos urgentes fuera del horario de oficina, no estamos disponibles las 24 horas del da, los 7 das de la semana.   Si tiene un problema urgente y no puede comunicarse con nosotros, puede optar por buscar atencin mdica  en el consultorio de su doctor(a), en una clnica privada, en un centro de atencin urgente o en una sala de emergencias.  Si tiene una emergencia mdica, por favor llame inmediatamente al 911 o vaya a la sala de emergencias.  Nmeros de bper  - Dr. Kowalski: [phone]  - Dra. Moye: [phone]  - Dra. Stewart: [phone]  En caso de inclemencias del tiempo, por favor llame a nuestra lnea principal al [phone] para una actualizacin sobre el estado de cualquier retraso o cierre.  Consejos para la medicacin en dermatologa: Por favor, guarde las cajas en las que vienen los medicamentos de uso tpico para ayudarle a seguir las instrucciones sobre dnde y cmo usarlos. Las farmacias generalmente imprimen las instrucciones del medicamento slo en las cajas y no directamente en los tubos del medicamento.   Si su medicamento es muy caro, por favor, pngase en contacto con nuestra oficina llamando al [phone] y presione la opcin 4 o envenos un mensaje a travs de MyChart.   No podemos decirle cul ser su copago por los medicamentos por adelantado ya que esto es diferente dependiendo de la cobertura de su seguro.  Sin embargo, es posible que podamos encontrar un medicamento sustituto a menor costo o llenar un formulario para que el seguro cubra el medicamento que se considera necesario.   Si se requiere una autorizacin previa para que su compaa de seguros cubra su medicamento, por favor permtanos de 1 a 2 das hbiles para completar este proceso.  Los precios de los medicamentos varan con frecuencia dependiendo del lugar de dnde se surte la receta y alguna farmacias pueden ofrecer precios ms baratos.  El sitio web www.goodrx.com tiene cupones para medicamentos de diferentes farmacias. Los precios aqu no tienen en cuenta lo que podra costar con la ayuda del seguro (puede ser ms barato con su seguro), pero el sitio web puede darle el precio si no utiliz ningn seguro.  - Puede imprimir el cupn correspondiente y llevarlo con su receta a la farmacia.  - Tambin puede pasar por nuestra oficina durante el horario de atencin regular y recoger una tarjeta de cupones de GoodRx.  -   Si necesita que su receta se enve electrnicamente a una farmacia diferente, informe a nuestra oficina a travs de MyChart de [place] o por telfono llamando al [phone] y presione la opcin 4.  

## 2022-06-13 ENCOUNTER — Ambulatory Visit: Payer: BC Managed Care – PPO | Admitting: Family Medicine

## 2022-06-17 ENCOUNTER — Encounter: Payer: Self-pay | Admitting: Dermatology

## 2022-07-11 DIAGNOSIS — E782 Mixed hyperlipidemia: Secondary | ICD-10-CM | POA: Diagnosis not present

## 2022-07-11 DIAGNOSIS — I10 Essential (primary) hypertension: Secondary | ICD-10-CM | POA: Diagnosis not present

## 2022-07-11 DIAGNOSIS — I700 Atherosclerosis of aorta: Secondary | ICD-10-CM | POA: Diagnosis not present

## 2022-07-11 DIAGNOSIS — E785 Hyperlipidemia, unspecified: Secondary | ICD-10-CM | POA: Diagnosis not present

## 2022-12-14 ENCOUNTER — Ambulatory Visit (INDEPENDENT_AMBULATORY_CARE_PROVIDER_SITE_OTHER): Payer: Medicare Other | Admitting: Dermatology

## 2022-12-14 ENCOUNTER — Encounter: Payer: Self-pay | Admitting: Dermatology

## 2022-12-14 VITALS — BP 185/90 | HR 68

## 2022-12-14 DIAGNOSIS — L578 Other skin changes due to chronic exposure to nonionizing radiation: Secondary | ICD-10-CM | POA: Diagnosis not present

## 2022-12-14 DIAGNOSIS — L820 Inflamed seborrheic keratosis: Secondary | ICD-10-CM

## 2022-12-14 DIAGNOSIS — L570 Actinic keratosis: Secondary | ICD-10-CM | POA: Diagnosis not present

## 2022-12-14 DIAGNOSIS — D692 Other nonthrombocytopenic purpura: Secondary | ICD-10-CM

## 2022-12-14 NOTE — Progress Notes (Signed)
Follow-Up Visit   Subjective  Adam Hanna. is a 89 y.o. male who presents for the following: 6 month AK follow up. Face and scalp. Tx with LN2 at last visit.  The patient has spots, moles and lesions to be evaluated, some may be new or changing and the patient may have concern these could be cancer.  Wife with patient.   The following portions of the chart were reviewed this encounter and updated as appropriate: medications, allergies, medical history  Review of Systems:  No other skin or systemic complaints except as noted in HPI or Assessment and Plan.  Objective  Well appearing patient in no apparent distress; mood and affect are within normal limits.   A focused examination was performed of the following areas: Scalp, face, ears, neck, arms, hands  Relevant exam findings are noted in the Assessment and Plan.   Assessment & Plan   ACTINIC DAMAGE - chronic, secondary to cumulative UV radiation exposure/sun exposure over time - diffuse scaly erythematous macules with underlying dyspigmentation - Recommend daily broad spectrum sunscreen SPF 30+ to sun-exposed areas, reapply every 2 hours as needed.  - Recommend staying in the shade or wearing long sleeves, sun glasses (UVA+UVB protection) and wide brim hats (4-inch brim around the entire circumference of the hat). - Call for new or changing lesions.  ACTINIC KERATOSIS Exam: Erythematous thin papules/macules with gritty scale  Actinic keratoses are precancerous spots that appear secondary to cumulative UV radiation exposure/sun exposure over time. They are chronic with expected duration over 1 year. A portion of actinic keratoses will progress to squamous cell carcinoma of the skin. It is not possible to reliably predict which spots will progress to skin cancer and so treatment is recommended to prevent development of skin cancer.  Recommend daily broad spectrum sunscreen SPF 30+ to sun-exposed areas, reapply every 2 hours  as needed.  Recommend staying in the shade or wearing long sleeves, sun glasses (UVA+UVB protection) and wide brim hats (4-inch brim around the entire circumference of the hat). Call for new or changing lesions.  Treatment Plan:  Prior to procedure, discussed risks of blister formation, small wound, skin dyspigmentation, or rare scar following cryotherapy. Recommend Vaseline ointment to treated areas while healing.  Destruction Procedure Note Destruction method: cryotherapy   Informed consent: discussed and consent obtained   Lesion destroyed using liquid nitrogen: Yes   Outcome: patient tolerated procedure well with no complications   Post-procedure details: wound care instructions given   Locations: arms, scalp, face # of Lesions Treated: 19  INFLAMED SEBORRHEIC KERATOSIS Exam: Erythematous keratotic or waxy stuck-on papule or plaque.  Symptomatic, irritating, patient would like treated.  Benign-appearing.  Call clinic for new or changing lesions.   Prior to procedure, discussed risks of blister formation, small wound, skin dyspigmentation, or rare scar following treatment. Recommend Vaseline ointment to treated areas while healing.  Destruction Procedure Note Destruction method: cryotherapy   Informed consent: discussed and consent obtained   Lesion destroyed using liquid nitrogen: Yes   Outcome: patient tolerated procedure well with no complications   Post-procedure details: wound care instructions given   Locations: arms, scalp, face # of Lesions Treated: 17  Purpura - Chronic; persistent and recurrent.  Treatable, but not curable. - Violaceous macules and patches at arms and hands. - Benign - Related to trauma, age, sun damage and/or use of blood thinners, chronic use of topical and/or oral steroids - Observe - Can use OTC arnica containing moisturizer such as Dermend  Bruise Formula if desired - Call for worsening or other concerns  Return in about 6 months (around  06/15/2023) for TBSE HxMM/BCC/SCC/DN/AKs, AK Follow Up.  I, Lawson Radar, CMA, am acting as scribe for Armida Sans, MD.  Documentation: I have reviewed the above documentation for accuracy and completeness, and I agree with the above.  Armida Sans, MD

## 2022-12-14 NOTE — Patient Instructions (Signed)
Cryotherapy Aftercare  Wash gently with soap and water everyday.   Apply Vaseline Jelly daily until healed.    Recommend daily broad spectrum sunscreen SPF 30+ to sun-exposed areas, reapply every 2 hours as needed. Call for new or changing lesions.  Staying in the shade or wearing long sleeves, sun glasses (UVA+UVB protection) and wide brim hats (4-inch brim around the entire circumference of the hat) are also recommended for sun protection.     Due to recent changes in healthcare laws, you may see results of your pathology and/or laboratory studies on MyChart before the doctors have had a chance to review them. We understand that in some cases there may be results that are confusing or concerning to you. Please understand that not all results are received at the same time and often the doctors may need to interpret multiple results in order to provide you with the best plan of care or course of treatment. Therefore, we ask that you please give us 2 business days to thoroughly review all your results before contacting the office for clarification. Should we see a critical lab result, you will be contacted sooner.   If You Need Anything After Your Visit  If you have any questions or concerns for your doctor, please call our main line at [phone] and press option 4 to reach your doctor's medical assistant. If no one answers, please leave a voicemail as directed and we will return your call as soon as possible. Messages left after 4 pm will be answered the following business day.   You may also send us a message via MyChart. We typically respond to MyChart messages within 1-2 business days.  For prescription refills, please ask your pharmacy to contact our office. Our fax number is [phone].  If you have an urgent issue when the clinic is closed that cannot wait until the next business day, you can page your doctor at the number below.    Please note that while we do our best to be  available for urgent issues outside of office hours, we are not available 24/7.   If you have an urgent issue and are unable to reach us, you may choose to seek medical care at your doctor's office, retail clinic, urgent care center, or emergency room.  If you have a medical emergency, please immediately call 911 or go to the emergency department.  Pager Numbers  - Dr. Kowalski: [phone]  - Dr. Moye: [phone]  - Dr. Stewart: [phone]  In the event of inclement weather, please call our main line at [phone] for an update on the status of any delays or closures.  Dermatology Medication Tips: Please keep the boxes that topical medications come in in order to help keep track of the instructions about where and how to use these. Pharmacies typically print the medication instructions only on the boxes and not directly on the medication tubes.   If your medication is too expensive, please contact our office at [phone] option 4 or send us a message through MyChart.   We are unable to tell what your co-pay for medications will be in advance as this is different depending on your insurance coverage. However, we may be able to find a substitute medication at lower cost or fill out paperwork to get insurance to cover a needed medication.   If a prior authorization is required to get your medication covered by your insurance company, please allow us 1-2 business days to complete this process.    Drug prices often vary depending on where the prescription is filled and some pharmacies may offer cheaper prices.  The website www.goodrx.com contains coupons for medications through different pharmacies. The prices here do not account for what the cost may be with help from insurance (it may be cheaper with your insurance), but the website can give you the price if you did not use any insurance.  - You can print the associated coupon and take it with your prescription to the pharmacy.  -  You may also stop by our office during regular business hours and pick up a GoodRx coupon card.  - If you need your prescription sent electronically to a different pharmacy, notify our office through [facility] MyChart or by phone at [phone] option 4.     Si Usted Necesita Algo Despus de Su Visita  Tambin puede enviarnos un mensaje a travs de MyChart. Por lo general respondemos a los mensajes de MyChart en el transcurso de 1 a 2 das hbiles.  Para renovar recetas, por favor pida a su farmacia que se ponga en contacto con nuestra oficina. Nuestro nmero de fax es el [phone].  Si tiene un asunto urgente cuando la clnica est cerrada y que no puede esperar hasta el siguiente da hbil, puede llamar/localizar a su doctor(a) al nmero que aparece a continuacin.   Por favor, tenga en cuenta que aunque hacemos todo lo posible para estar disponibles para asuntos urgentes fuera del horario de oficina, no estamos disponibles las 24 horas del da, los 7 das de la semana.   Si tiene un problema urgente y no puede comunicarse con nosotros, puede optar por buscar atencin mdica  en el consultorio de su doctor(a), en una clnica privada, en un centro de atencin urgente o en una sala de emergencias.  Si tiene una emergencia mdica, por favor llame inmediatamente al 911 o vaya a la sala de emergencias.  Nmeros de bper  - Dr. Kowalski: [phone]  - Dra. Moye: [phone]  - Dra. Stewart: [phone]  En caso de inclemencias del tiempo, por favor llame a nuestra lnea principal al [phone] para una actualizacin sobre el estado de cualquier retraso o cierre.  Consejos para la medicacin en dermatologa: Por favor, guarde las cajas en las que vienen los medicamentos de uso tpico para ayudarle a seguir las instrucciones sobre dnde y cmo usarlos. Las farmacias generalmente imprimen las instrucciones del medicamento slo en las cajas y no directamente en los tubos del  medicamento.   Si su medicamento es muy caro, por favor, pngase en contacto con nuestra oficina llamando al [phone] y presione la opcin 4 o envenos un mensaje a travs de MyChart.   No podemos decirle cul ser su copago por los medicamentos por adelantado ya que esto es diferente dependiendo de la cobertura de su seguro. Sin embargo, es posible que podamos encontrar un medicamento sustituto a menor costo o llenar un formulario para que el seguro cubra el medicamento que se considera necesario.   Si se requiere una autorizacin previa para que su compaa de seguros cubra su medicamento, por favor permtanos de 1 a 2 das hbiles para completar este proceso.  Los precios de los medicamentos varan con frecuencia dependiendo del lugar de dnde se surte la receta y alguna farmacias pueden ofrecer precios ms baratos.  El sitio web www.goodrx.com tiene cupones para medicamentos de diferentes farmacias. Los precios aqu no tienen en cuenta lo que podra costar con la ayuda del seguro (puede ser ms   barato con su seguro), pero el sitio web puede darle el precio si no utiliz ningn seguro.  - Puede imprimir el cupn correspondiente y llevarlo con su receta a la farmacia.  - Tambin puede pasar por nuestra oficina durante el horario de atencin regular y recoger una tarjeta de cupones de GoodRx.  - Si necesita que su receta se enve electrnicamente a una farmacia diferente, informe a nuestra oficina a travs de MyChart de [place] o por telfono llamando al [phone] y presione la opcin 4.  

## 2022-12-15 ENCOUNTER — Other Ambulatory Visit: Payer: Self-pay

## 2022-12-15 ENCOUNTER — Telehealth: Payer: Self-pay | Admitting: Family Medicine

## 2022-12-15 DIAGNOSIS — I10 Essential (primary) hypertension: Secondary | ICD-10-CM

## 2022-12-15 MED ORDER — TELMISARTAN 40 MG PO TABS: 40.0000 mg | ORAL_TABLET | Freq: Two times a day (BID) | ORAL | 0 refills | Status: AC

## 2022-12-15 NOTE — Telephone Encounter (Signed)
[facility] pharmacy faxed refill request for the following medications:    telmisartan (MICARDIS) 40 MG tablet   Please advise

## 2022-12-22 ENCOUNTER — Encounter: Payer: Self-pay | Admitting: Dermatology

## 2023-01-30 DIAGNOSIS — M6281 Muscle weakness (generalized): Secondary | ICD-10-CM | POA: Diagnosis not present

## 2023-01-30 DIAGNOSIS — E782 Mixed hyperlipidemia: Secondary | ICD-10-CM | POA: Diagnosis not present

## 2023-01-30 DIAGNOSIS — I700 Atherosclerosis of aorta: Secondary | ICD-10-CM | POA: Diagnosis not present

## 2023-01-30 DIAGNOSIS — I10 Essential (primary) hypertension: Secondary | ICD-10-CM | POA: Diagnosis not present

## 2023-01-30 DIAGNOSIS — G3184 Mild cognitive impairment, so stated: Secondary | ICD-10-CM | POA: Diagnosis not present

## 2023-03-19 DIAGNOSIS — B351 Tinea unguium: Secondary | ICD-10-CM | POA: Diagnosis not present

## 2023-03-19 DIAGNOSIS — M2042 Other hammer toe(s) (acquired), left foot: Secondary | ICD-10-CM | POA: Diagnosis not present

## 2023-03-19 DIAGNOSIS — M19072 Primary osteoarthritis, left ankle and foot: Secondary | ICD-10-CM | POA: Diagnosis not present

## 2023-03-19 DIAGNOSIS — Q6671 Congenital pes cavus, right foot: Secondary | ICD-10-CM | POA: Diagnosis not present

## 2023-03-19 DIAGNOSIS — L851 Acquired keratosis [keratoderma] palmaris et plantaris: Secondary | ICD-10-CM | POA: Diagnosis not present

## 2023-03-19 DIAGNOSIS — M79674 Pain in right toe(s): Secondary | ICD-10-CM | POA: Diagnosis not present

## 2023-03-19 DIAGNOSIS — L603 Nail dystrophy: Secondary | ICD-10-CM | POA: Diagnosis not present

## 2023-03-19 DIAGNOSIS — M79675 Pain in left toe(s): Secondary | ICD-10-CM | POA: Diagnosis not present

## 2023-03-19 DIAGNOSIS — L84 Corns and callosities: Secondary | ICD-10-CM | POA: Diagnosis not present

## 2023-03-19 DIAGNOSIS — M2041 Other hammer toe(s) (acquired), right foot: Secondary | ICD-10-CM | POA: Diagnosis not present

## 2023-03-19 DIAGNOSIS — Q6672 Congenital pes cavus, left foot: Secondary | ICD-10-CM | POA: Diagnosis not present

## 2023-03-19 DIAGNOSIS — M19071 Primary osteoarthritis, right ankle and foot: Secondary | ICD-10-CM | POA: Diagnosis not present

## 2023-05-09 DIAGNOSIS — M2042 Other hammer toe(s) (acquired), left foot: Secondary | ICD-10-CM | POA: Diagnosis not present

## 2023-05-09 DIAGNOSIS — I129 Hypertensive chronic kidney disease with stage 1 through stage 4 chronic kidney disease, or unspecified chronic kidney disease: Secondary | ICD-10-CM | POA: Diagnosis not present

## 2023-05-09 DIAGNOSIS — I700 Atherosclerosis of aorta: Secondary | ICD-10-CM | POA: Diagnosis not present

## 2023-05-09 DIAGNOSIS — M2041 Other hammer toe(s) (acquired), right foot: Secondary | ICD-10-CM | POA: Diagnosis not present

## 2023-05-09 DIAGNOSIS — B351 Tinea unguium: Secondary | ICD-10-CM | POA: Diagnosis not present

## 2023-05-09 DIAGNOSIS — N1832 Chronic kidney disease, stage 3b: Secondary | ICD-10-CM | POA: Diagnosis not present

## 2023-05-09 DIAGNOSIS — M5416 Radiculopathy, lumbar region: Secondary | ICD-10-CM | POA: Diagnosis not present

## 2023-05-09 DIAGNOSIS — N529 Male erectile dysfunction, unspecified: Secondary | ICD-10-CM | POA: Diagnosis not present

## 2023-05-09 DIAGNOSIS — Z87891 Personal history of nicotine dependence: Secondary | ICD-10-CM | POA: Diagnosis not present

## 2023-05-09 DIAGNOSIS — G3184 Mild cognitive impairment, so stated: Secondary | ICD-10-CM | POA: Diagnosis not present

## 2023-05-23 DIAGNOSIS — J300 Vasomotor rhinitis: Secondary | ICD-10-CM | POA: Diagnosis not present

## 2023-05-23 DIAGNOSIS — R430 Anosmia: Secondary | ICD-10-CM | POA: Diagnosis not present

## 2023-06-21 ENCOUNTER — Ambulatory Visit: Payer: Medicare Other | Admitting: Dermatology

## 2023-06-21 DIAGNOSIS — L821 Other seborrheic keratosis: Secondary | ICD-10-CM | POA: Diagnosis not present

## 2023-06-21 DIAGNOSIS — L570 Actinic keratosis: Secondary | ICD-10-CM

## 2023-06-21 DIAGNOSIS — Z1283 Encounter for screening for malignant neoplasm of skin: Secondary | ICD-10-CM | POA: Diagnosis not present

## 2023-06-21 DIAGNOSIS — W908XXA Exposure to other nonionizing radiation, initial encounter: Secondary | ICD-10-CM | POA: Diagnosis not present

## 2023-06-21 DIAGNOSIS — Z8589 Personal history of malignant neoplasm of other organs and systems: Secondary | ICD-10-CM

## 2023-06-21 DIAGNOSIS — Z85828 Personal history of other malignant neoplasm of skin: Secondary | ICD-10-CM | POA: Diagnosis not present

## 2023-06-21 DIAGNOSIS — D229 Melanocytic nevi, unspecified: Secondary | ICD-10-CM

## 2023-06-21 DIAGNOSIS — Z86018 Personal history of other benign neoplasm: Secondary | ICD-10-CM

## 2023-06-21 DIAGNOSIS — L578 Other skin changes due to chronic exposure to nonionizing radiation: Secondary | ICD-10-CM

## 2023-06-21 DIAGNOSIS — D1801 Hemangioma of skin and subcutaneous tissue: Secondary | ICD-10-CM | POA: Diagnosis not present

## 2023-06-21 DIAGNOSIS — Z85820 Personal history of malignant melanoma of skin: Secondary | ICD-10-CM | POA: Diagnosis not present

## 2023-06-21 DIAGNOSIS — L820 Inflamed seborrheic keratosis: Secondary | ICD-10-CM

## 2023-06-21 DIAGNOSIS — L814 Other melanin hyperpigmentation: Secondary | ICD-10-CM | POA: Diagnosis not present

## 2023-06-21 DIAGNOSIS — D692 Other nonthrombocytopenic purpura: Secondary | ICD-10-CM | POA: Diagnosis not present

## 2023-06-21 NOTE — Patient Instructions (Signed)

## 2023-06-21 NOTE — Progress Notes (Signed)
Follow-Up Visit   Subjective  Adam Hanna. is a 89 y.o. male who presents for the following: Skin Cancer Screening and Full Body Skin Exam  The patient presents for Total-Body Skin Exam (TBSE) for skin cancer screening and mole check. The patient has spots, moles and lesions to be evaluated, some may be new or changing and the patient may have concern these could be cancer.  Patient with hx of MM, DN, BCC and SCC. Patient c/o dark area at left great toenail, present for about 1 year.  The following portions of the chart were reviewed this encounter and updated as appropriate: medications, allergies, medical history  Review of Systems:  No other skin or systemic complaints except as noted in HPI or Assessment and Plan.  Objective  Well appearing patient in no apparent distress; mood and affect are within normal limits.  A full examination was performed including scalp, head, eyes, ears, nose, lips, neck, chest, axillae, abdomen, back, buttocks, bilateral upper extremities, bilateral lower extremities, hands, feet, fingers, toes, fingernails, and toenails. All findings within normal limits unless otherwise noted below.   Relevant physical exam findings are noted in the Assessment and Plan.  Scalp, face (16) Erythematous thin papules/macules with gritty scale.   arms x 4 (4) Erythematous stuck-on, waxy papule or plaque    Assessment & Plan   SKIN CANCER SCREENING PERFORMED TODAY.  ACTINIC DAMAGE - Chronic condition, secondary to cumulative UV/sun exposure - diffuse scaly erythematous macules with underlying dyspigmentation - Recommend daily broad spectrum sunscreen SPF 30+ to sun-exposed areas, reapply every 2 hours as needed.  - Staying in the shade or wearing long sleeves, sun glasses (UVA+UVB protection) and wide brim hats (4-inch brim around the entire circumference of the hat) are also recommended for sun protection.  - Call for new or changing lesions.  LENTIGINES,  SEBORRHEIC KERATOSES, HEMANGIOMAS - Benign normal skin lesions - Benign-appearing - Call for any changes  MELANOCYTIC NEVI - Tan-brown and/or pink-flesh-colored symmetric macules and papules - Benign appearing on exam today - Observation - Call clinic for new or changing moles - Recommend daily use of broad spectrum spf 30+ sunscreen to sun-exposed areas.   Purpura - Chronic; persistent and recurrent.  Treatable, but not curable. - Violaceous macules and patches - Benign - Related to trauma, age, sun damage and/or use of blood thinners, chronic use of topical and/or oral steroids - Observe - Can use OTC arnica containing moisturizer such as Dermend Bruise Formula if desired - Call for worsening or other concerns  History of Basal Cell Carcinoma of the Skin Multiple see history  - No evidence of recurrence today - Recommend regular full body skin exams - Recommend daily broad spectrum sunscreen SPF 30+ to sun-exposed areas, reapply every 2 hours as needed.  - Call if any new or changing lesions are noted between office visits    History of Squamous Cell Carcinoma of the Skin Multiple see history - No evidence of recurrence today - No lymphadenopathy - Recommend regular full body skin exams - Recommend daily broad spectrum sunscreen SPF 30+ to sun-exposed areas, reapply every 2 hours as needed.  - Call if any new or changing lesions are noted between office visits   History of Melanoma Left post auricular 1991 - No evidence of recurrence today - No lymphadenopathy - Recommend regular full body skin exams - Recommend daily broad spectrum sunscreen SPF 30+ to sun-exposed areas, reapply every 2 hours as needed.  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  Observation.  Call clinic for new or changing moles.  Recommend daily use of broad spectrum spf 30+ sunscreen to  sun-exposed areas.    AK (actinic keratosis) (16) Scalp, face  Actinic keratoses are precancerous spots that appear secondary to cumulative UV radiation exposure/sun exposure over time. They are chronic with expected duration over 1 year. A portion of actinic keratoses will progress to squamous cell carcinoma of the skin. It is not possible to reliably predict which spots will progress to skin cancer and so treatment is recommended to prevent development of skin cancer.  Recommend daily broad spectrum sunscreen SPF 30+ to sun-exposed areas, reapply every 2 hours as needed.  Recommend staying in the shade or wearing long sleeves, sun glasses (UVA+UVB protection) and wide brim hats (4-inch brim around the entire circumference of the hat). Call for new or changing lesions.   Destruction of lesion - Scalp, face (16) Complexity: simple   Destruction method: cryotherapy   Informed consent: discussed and consent obtained   Timeout:  patient name, date of birth, surgical site, and procedure verified Lesion destroyed using liquid nitrogen: Yes   Region frozen until ice ball extended beyond lesion: Yes   Outcome: patient tolerated procedure well with no complications   Post-procedure details: wound care instructions given    Inflamed seborrheic keratosis (4) arms x 4  Symptomatic, irritating, patient would like treated.  Benign-appearing.  Call clinic for new or changing lesions.    Destruction of lesion - arms x 4 (4)  Destruction method: cryotherapy   Informed consent: discussed and consent obtained   Lesion destroyed using liquid nitrogen: Yes   Cryotherapy cycles:  2 Outcome: patient tolerated procedure well with no complications   Post-procedure details: wound care instructions given     Return in about 1 year (around 06/20/2024) for TBSE, with Dr. Kirtland Bouchard, Hx MM, Hx AK.  Anise Salvo, RMA, am acting as scribe for Armida Sans, MD .   Documentation: I have reviewed the above  documentation for accuracy and completeness, and I agree with the above.  Armida Sans, MD

## 2023-07-01 ENCOUNTER — Encounter: Payer: Self-pay | Admitting: Dermatology

## 2023-07-02 DIAGNOSIS — I10 Essential (primary) hypertension: Secondary | ICD-10-CM | POA: Diagnosis not present

## 2023-07-02 DIAGNOSIS — R58 Hemorrhage, not elsewhere classified: Secondary | ICD-10-CM | POA: Diagnosis not present

## 2023-07-02 DIAGNOSIS — E782 Mixed hyperlipidemia: Secondary | ICD-10-CM | POA: Diagnosis not present

## 2023-07-02 DIAGNOSIS — I700 Atherosclerosis of aorta: Secondary | ICD-10-CM | POA: Diagnosis not present

## 2023-07-02 DIAGNOSIS — I5022 Chronic systolic (congestive) heart failure: Secondary | ICD-10-CM | POA: Diagnosis not present

## 2023-07-02 DIAGNOSIS — R9431 Abnormal electrocardiogram [ECG] [EKG]: Secondary | ICD-10-CM | POA: Diagnosis not present

## 2023-07-16 DIAGNOSIS — I5022 Chronic systolic (congestive) heart failure: Secondary | ICD-10-CM | POA: Diagnosis not present

## 2023-07-23 DIAGNOSIS — M79674 Pain in right toe(s): Secondary | ICD-10-CM | POA: Diagnosis not present

## 2023-07-23 DIAGNOSIS — L851 Acquired keratosis [keratoderma] palmaris et plantaris: Secondary | ICD-10-CM | POA: Diagnosis not present

## 2023-07-23 DIAGNOSIS — B351 Tinea unguium: Secondary | ICD-10-CM | POA: Diagnosis not present

## 2023-07-23 DIAGNOSIS — M79675 Pain in left toe(s): Secondary | ICD-10-CM | POA: Diagnosis not present

## 2023-07-25 DIAGNOSIS — E785 Hyperlipidemia, unspecified: Secondary | ICD-10-CM | POA: Diagnosis not present

## 2023-07-25 DIAGNOSIS — I700 Atherosclerosis of aorta: Secondary | ICD-10-CM | POA: Diagnosis not present

## 2023-07-25 DIAGNOSIS — E782 Mixed hyperlipidemia: Secondary | ICD-10-CM | POA: Diagnosis not present

## 2023-07-25 DIAGNOSIS — R9431 Abnormal electrocardiogram [ECG] [EKG]: Secondary | ICD-10-CM | POA: Diagnosis not present

## 2024-03-25 ENCOUNTER — Ambulatory Visit: Admitting: Dermatology

## 2024-06-25 ENCOUNTER — Ambulatory Visit: Payer: Medicare Other | Admitting: Dermatology
